# Patient Record
Sex: Female | Born: 1975 | Race: White | Hispanic: No | Marital: Single | Smoking: Never smoker
Health system: Southern US, Community
[De-identification: ages and names within clinical notes are randomized; demographics above are authoritative.]

## PROBLEM LIST (undated history)

## (undated) DIAGNOSIS — K219 Gastro-esophageal reflux disease without esophagitis: Secondary | ICD-10-CM

## (undated) DIAGNOSIS — K589 Irritable bowel syndrome without diarrhea: Secondary | ICD-10-CM

## (undated) DIAGNOSIS — D649 Anemia, unspecified: Secondary | ICD-10-CM

## (undated) DIAGNOSIS — I10 Essential (primary) hypertension: Secondary | ICD-10-CM

## (undated) DIAGNOSIS — F329 Major depressive disorder, single episode, unspecified: Secondary | ICD-10-CM

## (undated) DIAGNOSIS — C801 Malignant (primary) neoplasm, unspecified: Secondary | ICD-10-CM

## (undated) DIAGNOSIS — F32A Depression, unspecified: Secondary | ICD-10-CM

## (undated) HISTORY — PX: CHOLECYSTECTOMY: SHX55

## (undated) HISTORY — PX: PARTIAL NEPHRECTOMY: SHX414

---

## 2010-09-08 ENCOUNTER — Encounter: Payer: Self-pay | Admitting: *Deleted

## 2010-09-08 ENCOUNTER — Emergency Department (HOSPITAL_BASED_OUTPATIENT_CLINIC_OR_DEPARTMENT_OTHER)
Admission: EM | Admit: 2010-09-08 | Discharge: 2010-09-08 | Disposition: A | Discharge status: Home / Self Care | Payer: BC Managed Care – PPO | Attending: Emergency Medicine | Admitting: Emergency Medicine

## 2010-09-08 DIAGNOSIS — H6090 Unspecified otitis externa, unspecified ear: Secondary | ICD-10-CM

## 2010-09-08 DIAGNOSIS — H60399 Other infective otitis externa, unspecified ear: Secondary | ICD-10-CM | POA: Insufficient documentation

## 2010-09-08 DIAGNOSIS — I10 Essential (primary) hypertension: Secondary | ICD-10-CM | POA: Insufficient documentation

## 2010-09-08 DIAGNOSIS — H9209 Otalgia, unspecified ear: Secondary | ICD-10-CM | POA: Insufficient documentation

## 2010-09-08 HISTORY — DX: Essential (primary) hypertension: I10

## 2010-09-08 MED ORDER — CIPROFLOXACIN-DEXAMETHASONE 0.3-0.1 % OT SUSP
OTIC | Status: AC
  Filled 2010-09-08: qty 7.5

## 2010-09-08 MED ORDER — CIPROFLOXACIN-DEXAMETHASONE 0.3-0.1 % OT SUSP
4.0000 [drp] | Freq: Two times a day (BID) | OTIC | Status: DC
  Administered 2010-09-08: 4 [drp] via OTIC

## 2010-09-08 NOTE — ED Notes (Signed)
Poss abscess left ear x 1 week. Painful

## 2010-09-08 NOTE — ED Provider Notes (Signed)
Medical screening examination/treatment/procedure(s) were performed by non-physician practitioner and as supervising physician I was immediately available for consultation/collaboration.   Glynn Octave, MD 09/08/10 [phone]

## 2010-09-08 NOTE — ED Provider Notes (Signed)
History     Chief Complaint  Patient presents with  . Ear Problem   Patient is a 35 y.o. female presenting with ear pain. The history is provided by the patient. No language interpreter was used.  Otalgia This is a recurrent problem. The current episode started more than 1 week ago. There is pain in the left ear. The problem occurs constantly. The problem has not changed since onset.The pain is moderate. Pertinent negatives include no ear discharge, no headaches, no hearing loss, no rhinorrhea and no rash. Past medical history comments: pt state that it feels like she has a bump in her ear.    Past Medical History  Diagnosis Date  . Hypertension     Past Surgical History  Procedure Date  . Cholecystectomy     No family history on file.  History  Substance Use Topics  . Smoking status: Never Smoker   . Smokeless tobacco: Not on file  . Alcohol Use: No    OB History    Grav Para Term Preterm Abortions TAB SAB Ect Mult Living                  Review of Systems  HENT: Positive for ear pain. Negative for hearing loss, rhinorrhea and ear discharge.   Eyes: Negative.   Respiratory: Negative.   Cardiovascular: Negative.   Skin: Negative for rash.  Neurological: Negative for headaches.    Physical Exam  BP 145/96  Pulse 104  Temp(Src) 98.9 F (37.2 C) (Oral)  Resp 20  SpO2 99%  Physical Exam  Nursing note and vitals reviewed. Constitutional: She appears well-developed and well-nourished.  HENT:  Right Ear: Tympanic membrane and external ear normal.  Left Ear: Tympanic membrane normal. There is swelling.  Ears:  Neck: Normal range of motion.  Cardiovascular: Normal rate and regular rhythm.   Pulmonary/Chest: Effort normal and breath sounds normal.  Neurological: She is alert.  Skin: Skin is warm.    ED Course  Procedures  MDM Will treat for otitis externa:no earwick needed at this time      Teressa Lower, NP 09/08/10 2009

## 2011-03-14 ENCOUNTER — Encounter (HOSPITAL_BASED_OUTPATIENT_CLINIC_OR_DEPARTMENT_OTHER): Payer: Self-pay | Admitting: *Deleted

## 2011-03-14 ENCOUNTER — Emergency Department (HOSPITAL_BASED_OUTPATIENT_CLINIC_OR_DEPARTMENT_OTHER)
Admission: EM | Admit: 2011-03-14 | Discharge: 2011-03-14 | Disposition: A | Discharge status: Home / Self Care | Payer: BC Managed Care – PPO | Attending: Emergency Medicine | Admitting: Emergency Medicine

## 2011-03-14 DIAGNOSIS — L0291 Cutaneous abscess, unspecified: Secondary | ICD-10-CM

## 2011-03-14 DIAGNOSIS — I10 Essential (primary) hypertension: Secondary | ICD-10-CM | POA: Insufficient documentation

## 2011-03-14 DIAGNOSIS — N764 Abscess of vulva: Secondary | ICD-10-CM | POA: Insufficient documentation

## 2011-03-14 DIAGNOSIS — Z79899 Other long term (current) drug therapy: Secondary | ICD-10-CM | POA: Insufficient documentation

## 2011-03-14 MED ORDER — DOXYCYCLINE HYCLATE 100 MG PO CAPS: 100.0000 mg | ORAL_CAPSULE | Freq: Two times a day (BID) | ORAL | Status: AC

## 2011-03-14 MED ORDER — HYDROCODONE-ACETAMINOPHEN 5-500 MG PO TABS: 1.0000 | ORAL_TABLET | Freq: Four times a day (QID) | ORAL | Status: AC | PRN

## 2011-03-14 NOTE — ED Notes (Signed)
Boil in groin for 3 days no drainage

## 2011-03-14 NOTE — ED Notes (Signed)
Secondary Assessment- Pt reports a red, swollen painful area on right labia majora.  No drainage noted.

## 2011-03-14 NOTE — ED Notes (Signed)
Vrinda Pickering, FNP at bedside 

## 2011-03-14 NOTE — ED Provider Notes (Signed)
History     CSN: [id number]  Arrival date & time 03/14/11  1639   First MD Initiated Contact with Patient 03/14/11 1711      Chief Complaint  Patient presents with  . Recurrent Skin Infections    (Consider location/radiation/quality/duration/timing/severity/associated sxs/prior treatment) HPI Comments: Pt states that she thinks that she has a abscess or her labia  Patient is a 36 y.o. female presenting with abscess. The history is provided by the patient. No language interpreter was used.  Abscess  This is a new problem. The current episode started less than one week ago. The problem occurs continuously. The problem has been gradually worsening. The abscess is present on the genitalia. The abscess is characterized by painfulness and swelling. The abscess first occurred at home. Her past medical history does not include skin abscesses in family. There were no sick contacts. She has received no recent medical care.    Past Medical History  Diagnosis Date  . Hypertension     Past Surgical History  Procedure Date  . Cholecystectomy     History reviewed. No pertinent family history.  History  Substance Use Topics  . Smoking status: Never Smoker   . Smokeless tobacco: Not on file  . Alcohol Use: No    OB History    Grav Para Term Preterm Abortions TAB SAB Ect Mult Living                  Review of Systems  All other systems reviewed and are negative.    Allergies  Review of patient's allergies indicates no known allergies.  Home Medications   Current Outpatient Rx  Name Route Sig Dispense Refill  . BUPROPION HCL ER (XL) 300 MG PO TB24 Oral Take 300 mg by mouth daily.      . METHYLDOPA 500 MG PO TABS Oral Take 500 mg by mouth 2 (two) times daily.      Marland Kitchen RANITIDINE HCL 150 MG PO TABS Oral Take 150 mg by mouth daily.    Marland Kitchen DOXYCYCLINE HYCLATE 100 MG PO CAPS Oral Take 1 capsule (100 mg total) by mouth 2 (two) times daily. 14 capsule 0  . HYDROCODONE-ACETAMINOPHEN  5-500 MG PO TABS Oral Take 1-2 tablets by mouth every 6 (six) hours as needed for pain. 6 tablet 0    BP 156/100  Pulse 86  Temp(Src) 97.9 F (36.6 C) (Oral)  Resp 20  Ht 5\' 8"  (1.727 m)  Wt 272 lb (123.378 kg)  BMI 41.36 kg/m2  SpO2 99%  LMP 02/28/2011  Physical Exam  Nursing note and vitals reviewed. Constitutional: She is oriented to person, place, and time. She appears well-developed and well-nourished.  Cardiovascular: Normal rate and regular rhythm.   Pulmonary/Chest: Effort normal and breath sounds normal.  Musculoskeletal: Normal range of motion.  Neurological: She is alert and oriented to person, place, and time.  Skin:       Pt has redness and swelling to the right labia    ED Course  INCISION AND DRAINAGE Performed by: Teressa Lower Authorized by: Teressa Lower Consent: Verbal consent obtained. Written consent not obtained. Consent given by: patient Patient identity confirmed: verbally with patient Time out: Immediately prior to procedure a "time out" was called to verify the correct patient, procedure, equipment, support staff and site/side marked as required. Type: abscess Body area: anogenital (right labia) Anesthesia: local infiltration Local anesthetic: lidocaine 2% with epinephrine Scalpel size: 11 Incision type: single straight Drainage: purulent Drainage amount: moderate Packing  material: 1/2 in iodoform gauze Patient tolerance: Patient tolerated the procedure well with no immediate complications.   (including critical care time)  Labs Reviewed - No data to display No results found.   1. Abscess       MDM  Wound opened and pt tolerated procedure without any problem        Teressa Lower, NP 03/14/11 1740

## 2011-03-15 NOTE — ED Provider Notes (Signed)
Medical screening examination/treatment/procedure(s) were performed by non-physician practitioner and as supervising physician I was immediately available for consultation/collaboration.  Virtie Bungert, MD 03/15/11 1522 

## 2013-05-20 ENCOUNTER — Emergency Department (HOSPITAL_BASED_OUTPATIENT_CLINIC_OR_DEPARTMENT_OTHER)
Admission: EM | Admit: 2013-05-20 | Discharge: 2013-05-20 | Disposition: A | Discharge status: Home / Self Care | Payer: BC Managed Care – PPO | Attending: Emergency Medicine | Admitting: Emergency Medicine

## 2013-05-20 ENCOUNTER — Encounter (HOSPITAL_BASED_OUTPATIENT_CLINIC_OR_DEPARTMENT_OTHER): Payer: Self-pay | Admitting: Emergency Medicine

## 2013-05-20 DIAGNOSIS — J069 Acute upper respiratory infection, unspecified: Secondary | ICD-10-CM | POA: Insufficient documentation

## 2013-05-20 DIAGNOSIS — Z79899 Other long term (current) drug therapy: Secondary | ICD-10-CM | POA: Insufficient documentation

## 2013-05-20 DIAGNOSIS — I10 Essential (primary) hypertension: Secondary | ICD-10-CM | POA: Insufficient documentation

## 2013-05-20 LAB — RAPID STREP SCREEN (MED CTR MEBANE ONLY): Streptococcus, Group A Screen (Direct): NEGATIVE

## 2013-05-20 NOTE — ED Notes (Signed)
Pt amb to room 10 with quick steady gait in nad. Pt reports sudden onset of sore throat, painful swallowing and body aches yesterday. Strep obtained during triage, sent for testing.

## 2013-05-20 NOTE — ED Notes (Signed)
MD at bedside. 

## 2013-05-20 NOTE — ED Provider Notes (Signed)
CSN: [id number]     Arrival date & time 05/20/13  5400 History   First MD Initiated Contact with Patient 05/20/13 [phone]     Chief Complaint  Patient presents with  . Sore Throat  . Generalized Body Aches  . Nasal Congestion     (Consider location/radiation/quality/duration/timing/severity/associated sxs/prior Treatment) Patient is a 38 y.o. female presenting with pharyngitis.  Sore Throat   Pt reports onset of sore throat, chills and body aches last night after getting home from work. Has had some nasal congestion and mild cough recently as well. Was sent home from work due to being sick this morning.   Past Medical History  Diagnosis Date  . Hypertension    Past Surgical History  Procedure Laterality Date  . Cholecystectomy     History reviewed. No pertinent family history. History  Substance Use Topics  . Smoking status: Never Smoker   . Smokeless tobacco: Not on file  . Alcohol Use: No   OB History   Grav Para Term Preterm Abortions TAB SAB Ect Mult Living                 Review of Systems  All other systems reviewed and are negative except as noted in HPI.    Allergies  Review of patient's allergies indicates no known allergies.  Home Medications   Current Outpatient Rx  Name  Route  Sig  Dispense  Refill  . labetalol (NORMODYNE) 200 MG tablet   Oral   Take 200 mg by mouth 2 (two) times daily.         Marland Kitchen omeprazole (PRILOSEC) 20 MG capsule   Oral   Take 20 mg by mouth daily.         Marland Kitchen buPROPion (WELLBUTRIN XL) 300 MG 24 hr tablet   Oral   Take 300 mg by mouth daily.           . methyldopa (ALDOMET) 500 MG tablet   Oral   Take 500 mg by mouth 2 (two) times daily.           . ranitidine (ZANTAC 150 MAXIMUM STRENGTH) 150 MG tablet   Oral   Take 150 mg by mouth daily.          BP 155/94  Pulse 107  Temp(Src) 99 F (37.2 C) (Oral)  Ht 5\' 8"  (1.727 m)  Wt 273 lb (123.832 kg)  BMI 41.52 kg/m2  SpO2 99%  LMP 05/11/2013 Physical Exam   Nursing note and vitals reviewed. Constitutional: She is oriented to person, place, and time. She appears well-developed and well-nourished.  HENT:  Head: Normocephalic and atraumatic.  Mouth/Throat: No oropharyngeal exudate.  Eyes: EOM are normal. Pupils are equal, round, and reactive to light.  Neck: Normal range of motion. Neck supple.  Cardiovascular: Normal rate, normal heart sounds and intact distal pulses.   Pulmonary/Chest: Effort normal and breath sounds normal.  Abdominal: Bowel sounds are normal. She exhibits no distension. There is no tenderness.  Musculoskeletal: Normal range of motion. She exhibits no edema and no tenderness.  Lymphadenopathy:    She has no cervical adenopathy.  Neurological: She is alert and oriented to person, place, and time. She has normal strength. No cranial nerve deficit or sensory deficit.  Skin: Skin is warm and dry. No rash noted.  Psychiatric: She has a normal mood and affect.    ED Course  Procedures (including critical care time) Labs Review Labs Reviewed  RAPID STREP SCREEN  CULTURE, GROUP A  STREP   Imaging Review No results found.   EKG Interpretation None      MDM   Final diagnoses:  Viral URI    Strep neg. Likely viral URI.     Aizley Stenseth B. Karle Starch, MD 05/20/13 [phone]

## 2013-05-20 NOTE — Discharge Instructions (Signed)

## 2013-05-22 LAB — CULTURE, GROUP A STREP

## 2013-10-15 ENCOUNTER — Encounter (HOSPITAL_BASED_OUTPATIENT_CLINIC_OR_DEPARTMENT_OTHER): Payer: Self-pay | Admitting: Emergency Medicine

## 2013-10-15 ENCOUNTER — Emergency Department (HOSPITAL_BASED_OUTPATIENT_CLINIC_OR_DEPARTMENT_OTHER)
Admission: EM | Admit: 2013-10-15 | Discharge: 2013-10-15 | Disposition: A | Discharge status: Home / Self Care | Payer: BC Managed Care – PPO | Attending: Emergency Medicine | Admitting: Emergency Medicine

## 2013-10-15 DIAGNOSIS — R109 Unspecified abdominal pain: Secondary | ICD-10-CM | POA: Insufficient documentation

## 2013-10-15 DIAGNOSIS — Z79899 Other long term (current) drug therapy: Secondary | ICD-10-CM | POA: Insufficient documentation

## 2013-10-15 DIAGNOSIS — Z3202 Encounter for pregnancy test, result negative: Secondary | ICD-10-CM | POA: Diagnosis not present

## 2013-10-15 DIAGNOSIS — Z792 Long term (current) use of antibiotics: Secondary | ICD-10-CM | POA: Insufficient documentation

## 2013-10-15 DIAGNOSIS — I10 Essential (primary) hypertension: Secondary | ICD-10-CM | POA: Insufficient documentation

## 2013-10-15 DIAGNOSIS — N390 Urinary tract infection, site not specified: Secondary | ICD-10-CM | POA: Diagnosis not present

## 2013-10-15 LAB — URINALYSIS, ROUTINE W REFLEX MICROSCOPIC
Bilirubin Urine: NEGATIVE
Glucose, UA: NEGATIVE mg/dL
HGB URINE DIPSTICK: NEGATIVE
Ketones, ur: NEGATIVE mg/dL
NITRITE: NEGATIVE
Protein, ur: 100 mg/dL — AB
SPECIFIC GRAVITY, URINE: 1.026 (ref 1.005–1.030)
UROBILINOGEN UA: 0.2 mg/dL (ref 0.0–1.0)
pH: 5.5 (ref 5.0–8.0)

## 2013-10-15 LAB — URINE MICROSCOPIC-ADD ON

## 2013-10-15 LAB — PREGNANCY, URINE: PREG TEST UR: NEGATIVE

## 2013-10-15 MED ORDER — CEPHALEXIN 250 MG PO CAPS
500.0000 mg | ORAL_CAPSULE | Freq: Once | ORAL | Status: AC
  Administered 2013-10-15: 500 mg via ORAL
  Filled 2013-10-15: qty 2

## 2013-10-15 MED ORDER — CEPHALEXIN 500 MG PO CAPS
500.0000 mg | ORAL_CAPSULE | Freq: Four times a day (QID) | ORAL | Status: DC
Start: 2013-10-15 — End: 2015-11-26

## 2013-10-15 NOTE — ED Notes (Signed)
Pt discharged to home with family. NAD.  

## 2013-10-15 NOTE — ED Notes (Signed)
Pt reports left flank pain x1 week also reports feq urination

## 2013-10-15 NOTE — Discharge Instructions (Signed)
°  Take acetaminophen (Tylenol) up to 975 mg (this is normally 3 over-the-counter pills) up to 3 times a day. Do not drink alcohol. Make sure your other medications do not contain acetaminophen (Read the labels!)  Take your antibiotics as directed and to completion. You should never have any leftover antibiotics! Push fluids and stay well hydrated.   Please follow with your primary care doctor in the next 2 days for a check-up. They must obtain records for further management.   Do not hesitate to return to the Emergency Department for any new, worsening or concerning symptoms.

## 2013-10-15 NOTE — ED Provider Notes (Signed)
CSN: [id number]     Arrival date & time 10/15/13  1912 History   First MD Initiated Contact with Patient 10/15/13 1927     Chief Complaint  Patient presents with  . Flank Pain     (Consider location/radiation/quality/duration/timing/severity/associated sxs/prior Treatment) HPI  Valerie Whitehead is a 38 y.o. female complaining of left flank pain worsening over the course of one week described as sharp and stabbing, it is colicky, rated at 4/27 at worse. Patient has been taking Motrin at home with little relief. Patient denies hematuria, dysuria, foul-smelling more concentrated urine, abnormal vaginal discharge, history of kidney stones, trauma. Endorses nausea and urinary frequency on review of systems.  Past Medical History  Diagnosis Date  . Hypertension    Past Surgical History  Procedure Laterality Date  . Cholecystectomy     History reviewed. No pertinent family history. History  Substance Use Topics  . Smoking status: Never Smoker   . Smokeless tobacco: Never Used  . Alcohol Use: No   OB History   Grav Para Term Preterm Abortions TAB SAB Ect Mult Living                 Review of Systems  10 systems reviewed and found to be negative, except as noted in the HPI.   Allergies  Review of patient's allergies indicates no known allergies.  Home Medications   Prior to Admission medications   Medication Sig Start Date End Date Taking? Authorizing Provider  buPROPion (WELLBUTRIN XL) 300 MG 24 hr tablet Take 300 mg by mouth daily.      Historical Provider, MD  cephALEXin (KEFLEX) 500 MG capsule Take 1 capsule (500 mg total) by mouth 4 (four) times daily. 10/15/13   Josephanthony Tindel, PA-C  labetalol (NORMODYNE) 200 MG tablet Take 200 mg by mouth 2 (two) times daily.    Historical Provider, MD  omeprazole (PRILOSEC) 20 MG capsule Take 20 mg by mouth daily.    Historical Provider, MD   BP 173/100  Pulse 86  Temp(Src) 97.9 F (36.6 C) (Oral)  Resp 18  SpO2 100%  LMP  10/01/2013 Physical Exam  Nursing note and vitals reviewed. Constitutional: She is oriented to person, place, and time. She appears well-developed and well-nourished. No distress.  HENT:  Head: Normocephalic.  Mouth/Throat: Oropharynx is clear and moist.  Eyes: Conjunctivae and EOM are normal. Pupils are equal, round, and reactive to light.  Cardiovascular: Normal rate, regular rhythm and intact distal pulses.   Pulmonary/Chest: Effort normal and breath sounds normal. No stridor. No respiratory distress. She has no wheezes. She has no rales. She exhibits no tenderness.  Abdominal: Soft. Bowel sounds are normal. She exhibits no distension and no mass. There is no tenderness. There is no rebound and no guarding.  Genitourinary:  No CVA tenderness to palpation bilaterally  Musculoskeletal: Normal range of motion.  Neurological: She is alert and oriented to person, place, and time.  Psychiatric: She has a normal mood and affect.    ED Course  Procedures (including critical care time) Labs Review Labs Reviewed  URINALYSIS, ROUTINE W REFLEX MICROSCOPIC - Abnormal; Notable for the following:    APPearance CLOUDY (*)    Protein, ur 100 (*)    Leukocytes, UA TRACE (*)    All other components within normal limits  URINE MICROSCOPIC-ADD ON - Abnormal; Notable for the following:    Squamous Epithelial / LPF MANY (*)    Bacteria, UA MANY (*)    All other components within  normal limits  URINE CULTURE  PREGNANCY, URINE    Imaging Review No results found.   EKG Interpretation None      MDM   Final diagnoses:  UTI (lower urinary tract infection)  Flank pain    Filed Vitals:   10/15/13 1920  BP: 173/100  Pulse: 86  Temp: 97.9 F (36.6 C)  TempSrc: Oral  Resp: 18  SpO2: 100%    Medications  cephALEXin (KEFLEX) capsule 500 mg (not administered)    Valerie Whitehead is a 38 y.o. female presenting with left flank pain and urinary frequency. Patient afebrile and  well-appearing. Urinalysis is contaminated but shows many bacteria and trace leukocytes. Will culture urine and start treatment with Keflex.  Evaluation does not show pathology that would require ongoing emergent intervention or inpatient treatment. Pt is hemodynamically stable and mentating appropriately. Discussed findings and plan with patient/guardian, who agrees with care plan. All questions answered. Return precautions discussed and outpatient follow up given.   New Prescriptions   CEPHALEXIN (KEFLEX) 500 MG CAPSULE    Take 1 capsule (500 mg total) by mouth 4 (four) times daily.         Monico Blitz, PA-C 10/15/13 2033

## 2013-10-16 NOTE — ED Provider Notes (Signed)
Medical screening examination/treatment/procedure(s) were performed by non-physician practitioner and as supervising physician I was immediately available for consultation/collaboration.   EKG Interpretation None        Fredia Sorrow, MD 10/16/13 [phone]

## 2013-10-17 LAB — URINE CULTURE: Colony Count: 30000

## 2013-12-29 ENCOUNTER — Emergency Department (HOSPITAL_BASED_OUTPATIENT_CLINIC_OR_DEPARTMENT_OTHER)
Admission: EM | Admit: 2013-12-29 | Discharge: 2013-12-29 | Disposition: A | Discharge status: Home / Self Care | Payer: BC Managed Care – PPO | Attending: Emergency Medicine | Admitting: Emergency Medicine

## 2013-12-29 ENCOUNTER — Encounter (HOSPITAL_BASED_OUTPATIENT_CLINIC_OR_DEPARTMENT_OTHER): Payer: Self-pay | Admitting: Emergency Medicine

## 2013-12-29 DIAGNOSIS — I10 Essential (primary) hypertension: Secondary | ICD-10-CM | POA: Diagnosis not present

## 2013-12-29 DIAGNOSIS — Z792 Long term (current) use of antibiotics: Secondary | ICD-10-CM | POA: Diagnosis not present

## 2013-12-29 DIAGNOSIS — M62838 Other muscle spasm: Secondary | ICD-10-CM | POA: Insufficient documentation

## 2013-12-29 DIAGNOSIS — Y998 Other external cause status: Secondary | ICD-10-CM | POA: Diagnosis not present

## 2013-12-29 DIAGNOSIS — S161XXA Strain of muscle, fascia and tendon at neck level, initial encounter: Secondary | ICD-10-CM | POA: Diagnosis not present

## 2013-12-29 DIAGNOSIS — S199XXA Unspecified injury of neck, initial encounter: Secondary | ICD-10-CM | POA: Diagnosis present

## 2013-12-29 DIAGNOSIS — Z79899 Other long term (current) drug therapy: Secondary | ICD-10-CM | POA: Insufficient documentation

## 2013-12-29 DIAGNOSIS — Y9389 Activity, other specified: Secondary | ICD-10-CM | POA: Diagnosis not present

## 2013-12-29 DIAGNOSIS — Y92410 Unspecified street and highway as the place of occurrence of the external cause: Secondary | ICD-10-CM | POA: Diagnosis not present

## 2013-12-29 MED ORDER — HYDROCODONE-ACETAMINOPHEN 5-325 MG PO TABS: 1.0000 | ORAL_TABLET | ORAL | Status: DC | PRN

## 2013-12-29 MED ORDER — IBUPROFEN 800 MG PO TABS: 800.0000 mg | ORAL_TABLET | Freq: Three times a day (TID) | ORAL | Status: DC

## 2013-12-29 MED ORDER — DIAZEPAM 5 MG PO TABS: 5.0000 mg | ORAL_TABLET | Freq: Two times a day (BID) | ORAL | Status: DC
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
## 2015-11-26 ENCOUNTER — Emergency Department (HOSPITAL_BASED_OUTPATIENT_CLINIC_OR_DEPARTMENT_OTHER): Payer: BLUE CROSS/BLUE SHIELD

## 2015-11-26 ENCOUNTER — Encounter (HOSPITAL_BASED_OUTPATIENT_CLINIC_OR_DEPARTMENT_OTHER): Payer: Self-pay

## 2015-11-26 ENCOUNTER — Emergency Department (HOSPITAL_BASED_OUTPATIENT_CLINIC_OR_DEPARTMENT_OTHER)
Admission: EM | Admit: 2015-11-26 | Discharge: 2015-11-26 | Disposition: A | Discharge status: Home / Self Care | Payer: BLUE CROSS/BLUE SHIELD | Attending: Emergency Medicine | Admitting: Emergency Medicine

## 2015-11-26 DIAGNOSIS — M549 Dorsalgia, unspecified: Secondary | ICD-10-CM | POA: Insufficient documentation

## 2015-11-26 DIAGNOSIS — I10 Essential (primary) hypertension: Secondary | ICD-10-CM | POA: Diagnosis not present

## 2015-11-26 DIAGNOSIS — Z79899 Other long term (current) drug therapy: Secondary | ICD-10-CM | POA: Diagnosis not present

## 2015-11-26 DIAGNOSIS — R079 Chest pain, unspecified: Secondary | ICD-10-CM | POA: Diagnosis not present

## 2015-11-26 HISTORY — DX: Gastro-esophageal reflux disease without esophagitis: K21.9

## 2015-11-26 HISTORY — DX: Depression, unspecified: F32.A

## 2015-11-26 HISTORY — DX: Major depressive disorder, single episode, unspecified: F32.9

## 2015-11-26 LAB — D-DIMER, QUANTITATIVE (NOT AT ARMC): D DIMER QUANT: 0.36 ug{FEU}/mL (ref 0.00–0.50)

## 2015-11-26 LAB — TROPONIN I
TROPONIN I: 0.03 ng/mL — AB (ref <0.03)
Troponin I: 0.03 ng/mL (ref <0.03)

## 2015-11-26 LAB — CBC
HEMATOCRIT: 36.1 % (ref 36.0–46.0)
Hemoglobin: 11.7 g/dL — ABNORMAL LOW (ref 12.0–15.0)
MCH: 26.4 pg (ref 26.0–34.0)
MCHC: 32.4 g/dL (ref 30.0–36.0)
MCV: 81.5 fL (ref 78.0–100.0)
Platelets: 405 10*3/uL — ABNORMAL HIGH (ref 150–400)
RBC: 4.43 MIL/uL (ref 3.87–5.11)
RDW: 14.6 % (ref 11.5–15.5)
WBC: 7.2 10*3/uL (ref 4.0–10.5)

## 2015-11-26 LAB — BASIC METABOLIC PANEL
Anion gap: 7 (ref 5–15)
BUN: 11 mg/dL (ref 6–20)
CO2: 26 mmol/L (ref 22–32)
Calcium: 9.4 mg/dL (ref 8.9–10.3)
Chloride: 105 mmol/L (ref 101–111)
Creatinine, Ser: 0.83 mg/dL (ref 0.44–1.00)
GFR calc Af Amer: >60 mL/min (ref >60)
GLUCOSE: 104 mg/dL — AB (ref 65–99)
POTASSIUM: 3.5 mmol/L (ref 3.5–5.1)
Sodium: 138 mmol/L (ref 135–145)

## 2015-11-26 MED ORDER — ASPIRIN 81 MG PO CHEW
324.0000 mg | CHEWABLE_TABLET | Freq: Once | ORAL | Status: AC
  Administered 2015-11-26: 324 mg via ORAL
  Filled 2015-11-26: qty 4

## 2015-11-26 NOTE — Discharge Instructions (Signed)
Call the cardiology clinic listed above to schedule a follow-up appointment within the next week for further evaluation and management of your chest pain. I recommend having an outpatient stress test scheduled for further evaluation. Please return to the Emergency Department if symptoms worsen or new onset of fever, difficulty breathing, new/worsening chest pain, abdominal pain, vomiting, numbness, tingling, weakness.

## 2015-11-26 NOTE — ED Triage Notes (Signed)
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

## 2015-11-26 NOTE — ED Notes (Signed)
Pa  at bedside. 

## 2015-11-26 NOTE — ED Provider Notes (Signed)
[facility] Provider Note   CSN: [id number] Arrival date & time: 11/26/15  1205     History   Chief Complaint Chief Complaint  Patient presents with  . Chest Pain    HPI Raizel Perna is a 40 y.o. female.  Patient is a 40 year old female with history of hypertension who presents the ED with complaint of chest pain, onset 10 AM. Patient reports when she woke up this morning she began to have constant sharp pain to the left side of her chest. Patient reports pain is worse with movements of her chest or left arm. Denies any alleviating factors. Denies radiation. She also notes 3 days ago she began having pain in her left mid back which she describes as constant sharp pain and notes it is worse with movement. 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 Denies taking any medications for her symptoms today but notes she has taken ibuprofen over the past 2 days with mild intermittent relief. Denies fever, chills, headache, lightheadedness, dizziness, diaphoresis, cough, shortness of breath, palpitations, abdominal pain, nausea, vomiting, numbness, tingling, weakness. Patient denies smoking. Denies personal or family history of cardiac disease.      Past Medical History:  Diagnosis Date  . Depression   . GERD (gastroesophageal reflux disease)   . Hypertension     There are no active problems to display for this patient.   Past Surgical History:  Procedure Laterality Date  . CHOLECYSTECTOMY      OB History    No data available       Home Medications    Prior to Admission medications   Medication Sig Start Date End Date Taking? Authorizing Provider  buPROPion (WELLBUTRIN XL) 300 MG 24 hr tablet Take 300 mg by mouth daily.      Historical Provider, MD  labetalol (NORMODYNE) 200 MG tablet Take 200 mg by mouth 2 (two) times daily.    Historical Provider, MD  omeprazole (PRILOSEC) 20 MG  capsule Take 20 mg by mouth daily.    Historical Provider, MD    Family History No family history on file.  Social History Social History  Substance Use Topics  . Smoking status: Never Smoker  . Smokeless tobacco: Never Used  . Alcohol use No     Allergies   Review of patient's allergies indicates no known allergies.   Review of Systems Review of Systems  Cardiovascular: Positive for chest pain.  Musculoskeletal: Positive for back pain.  All other systems reviewed and are negative.    Physical Exam Updated Vital Signs BP 134/86   Pulse 93   Temp 98.6 F (37 C) (Oral)   Resp (!) 29   LMP 11/14/2015   SpO2 99%   Physical Exam  Constitutional: She is oriented to person, place, and time. She appears well-developed and well-nourished.  HENT:  Head: Normocephalic and atraumatic.  Mouth/Throat: Oropharynx is clear and moist. No oropharyngeal exudate.  Eyes: Conjunctivae and EOM are normal. Right eye exhibits no discharge. Left eye exhibits no discharge. No scleral icterus.  Neck: Normal range of motion. Neck supple.  Cardiovascular: Normal rate, regular rhythm, normal heart sounds and intact distal pulses.   Pulmonary/Chest: Effort normal and breath sounds normal. No respiratory distress. She has no wheezes. She has no rales. She exhibits no tenderness.  Abdominal: Soft. Bowel sounds are normal. She exhibits no distension and no mass. There is no tenderness. There  is no rebound and no guarding.  Musculoskeletal: Normal range of motion. She exhibits tenderness. She exhibits no edema or deformity.  No midline C, T, or L tenderness. TTP over left lateral inferior latissimus inferior to scapula. Full range of motion of neck and back. Full range of motion of bilateral upper and lower extremities, with 5/5 strength. Sensation intact. 2+ radial and PT pulses. Cap refill <2 seconds.   Neurological: She is alert and oriented to person, place, and time.  Skin: Skin is warm and dry.    Nursing note and vitals reviewed.    ED Treatments / Results  Labs (all labs ordered are listed, but only abnormal results are displayed) Labs Reviewed  BASIC METABOLIC PANEL - Abnormal; Notable for the following:       Result Value   Glucose, Bld 104 (*)    All other components within normal limits  CBC - Abnormal; Notable for the following:    Hemoglobin 11.7 (*)    Platelets 405 (*)    All other components within normal limits  TROPONIN I - Abnormal; Notable for the following:    Troponin I 0.03 (*)    All other components within normal limits  TROPONIN I - Abnormal; Notable for the following:    Troponin I 0.03 (*)    All other components within normal limits  D-DIMER, QUANTITATIVE (NOT AT [facility])    EKG  EKG Interpretation  Date/Time:  Monday November 26 2015 12:12:47 EDT Ventricular Rate:  111 PR Interval:  148 QRS Duration: 88 QT Interval:  348 QTC Calculation: 473 R Axis:   73 Text Interpretation:  Sinus tachycardia Otherwise normal ECG Confirmed by Hazle Coca [phone]) on 11/26/2015 12:16:01 PM Also confirmed by Hazle Coca [phone]), editor Yehuda Mao [phone])  on 11/26/2015 12:26:27 PM       Radiology Dg Chest 2 View  Result Date: 11/26/2015 CLINICAL DATA:  Left-sided chest pain, hypertension EXAM: CHEST  2 VIEW COMPARISON:  Chest radiograph 11/25/2014 FINDINGS: Cardiomediastinal contours are normal. No pneumothorax or pleural effusion. No focal airspace consolidation or pulmonary edema. IMPRESSION: Clear lungs. Electronically Signed   By: Ulyses Jarred M.D.   On: 11/26/2015 13:54    Procedures Procedures (including critical care time)  Medications Ordered in ED Medications  aspirin chewable tablet 324 mg (324 mg Oral Given 11/26/15 1423)     Initial Impression / Assessment and Plan / ED Course  I have reviewed the triage vital signs and the nursing notes.  Pertinent labs & imaging results that were available during my care of the patient were  reviewed by me and considered in my medical decision making (see chart for details).  Clinical Course    Patient presents with constant sharp left-sided chest pain that started this morning when she woke up around 10 AM. She also reports having similar pain in her back that started 3 days ago and notes both areas of pain are worse with movement. Denies fever or shortness of breath. VSS. Exam revealed mild tenderness over muscles of left mid back, no midline spinal tenderness. Lungs clear to auscultation bilaterally, no chest wall tenderness. Remaining exam unremarkable. EKG showed sinus tachycardia, heart rate 111 with no acute ischemic changes noted. Initial troponin 0.03. Remaining labs unremarkable. D-dimer negative. Chest x-ray negative. HEART score 2. Discussed case with Dr. Ralene Bathe. Plan to order delta trop. Discussed pt with cardiology. Dr. Oval Linsey advised that if delta trop is unchanged or negative, pt can be d/c home with outpatient cardiac  follow up for stress testing; if delta trop increased advised to admit for further evaluation. Delta trop unchanged. Discussed results and plan for discharge with patient. Plan to discharge patient home with outpatient cardiology follow-up for outpatient stress test. Discussed return precautions with patient.  Final Clinical Impressions(s) / ED Diagnoses   Final diagnoses:  Chest pain, unspecified type    New Prescriptions New Prescriptions   No medications on file       Nona Dell, PA-C 11/26/15 [place], MD 11/27/15 1000

## 2015-11-26 NOTE — ED Notes (Signed)
CRITICAL VALUE ALERT  Critical value received:  Troponin 0.03  Date of notification:  11/26/15 Time of notification:  [id number]  Critical value read back: yes  Nurse who received alert:  Su Grand  MD notified (1st page):  [phone]

## 2015-12-09 NOTE — Progress Notes (Signed)
Cardiology Office Note   Date:  12/10/2015   ID:  Valerie Whitehead, DOB 30-Jan-1976, MRN [id number]  PCP:  Alma Friendly, MD  Cardiologist:   Minus Breeding, MD  Referring:  Nona Dell, PA-C   Chief Complaint  Patient presents with  . Follow-up    ED visit 11-26-15     History of Present Illness: Valerie Whitehead is a 40 y.o. female who presents for evaluation of chest pain.  He was in the ED recently with chest pain.  The pain was atypical but the troponin was 0.03 x 2.  I reviewed the ED records.  There were no acute EKG changes.  She reports that she's been under a lot of stress. She works 2 jobs. She is raising her 36 year old nephew was being raised by her parents who both died. She also has 2 young children of her own. She reports she was having discomfort and went to the emergency room in October. She thought in retrospect was probably something related to lifting or pulling. He works as a Quarry manager. She does not get chest discomfort any longer. She's not having any new shortness of breath, PND or orthopnea. She's not having any palpitations, presyncope or syncope. She is otherwise not getting this chest discomfort. She's not having any new shortness of breath, PND or orthopnea. She can be active at work without bringing on symptoms.   Past Medical History:  Diagnosis Date  . Depression    Post partum.    Marland Kitchen GERD (gastroesophageal reflux disease)   . Hypertension     Past Surgical History:  Procedure Laterality Date  . CHOLECYSTECTOMY       Current Outpatient Prescriptions  Medication Sig Dispense Refill  . buPROPion (WELLBUTRIN XL) 300 MG 24 hr tablet Take 300 mg by mouth daily.      Marland Kitchen labetalol (NORMODYNE) 300 MG tablet Take 300 mg by mouth 2 (two) times daily.    Marland Kitchen omeprazole (PRILOSEC) 20 MG capsule Take 20 mg by mouth daily.     No current facility-administered medications for this visit.     Allergies:   Review of patient's allergies indicates no  known allergies.    Social History:  The patient  reports that she has never smoked. She has never used smokeless tobacco. She reports that she does not drink alcohol or use drugs.   Family History:  The patient's family history includes Dementia in her mother; Hypertension in her father; Peripheral vascular disease in her mother; Stroke in her father.    ROS:  Please see the history of present illness.   Otherwise, review of systems are positive for none.   All other systems are reviewed and negative.    PHYSICAL EXAM: VS:  BP 120/60 (BP Location: Right Arm)   Pulse 87   Ht 5\' 8"  (1.727 m)   Wt 287 lb 9.6 oz (130.5 kg)   LMP 11/14/2015   BMI 43.73 kg/m  , BMI Body mass index is 43.73 kg/m. GENERAL:  Well appearing HEENT:  Pupils equal round and reactive, fundi not visualized, oral mucosa unremarkable NECK:  No jugular venous distention, waveform within normal limits, carotid upstroke brisk and symmetric, no bruits, no thyromegaly LYMPHATICS:  No cervical, inguinal adenopathy LUNGS:  Clear to auscultation bilaterally BACK:  No CVA tenderness CHEST:  Unremarkable HEART:  PMI not displaced or sustained,S1 and S2 within normal limits, no S3, no S4, no clicks, no rubs, no murmurs ABD:  Flat, positive bowel sounds normal  in frequency in pitch, no bruits, no rebound, no guarding, no midline pulsatile mass, no hepatomegaly, no splenomegaly EXT:  2 plus pulses throughout, no edema, no cyanosis no clubbing SKIN:  No rashes no nodules NEURO:  Cranial nerves II through XII grossly intact, motor grossly intact throughout PSYCH:  Cognitively intact, oriented to person place and time, slightly tearful appropriately.      EKG:  EKG is not ordered today. The ekg ordered 11/26/15 demonstrates sinus rhythm, rate 111, axis within normal limits, QTC slightly increased, no acute ST-T wave changes.   Recent Labs: 11/26/2015: BUN 11; Creatinine, Ser 0.83; Hemoglobin 11.7; Platelets 405; Potassium  3.5; Sodium 138    Lipid Panel No results found for: CHOL, TRIG, HDL, CHOLHDL, VLDL, LDLCALC, LDLDIRECT    Wt Readings from Last 3 Encounters:  12/10/15 287 lb 9.6 oz (130.5 kg)  12/29/13 270 lb (122.5 kg)  05/20/13 273 lb (123.8 kg)      Other studies Reviewed: Additional studies/ records that were reviewed today include: ED records. Review of the above records demonstrates:  Please see elsewhere in the note.     ASSESSMENT AND PLAN:   CHEST PAIN:    Her chest pain is atypical. However, there was a very borderline troponin elevation. POET (Plain Old Exercise Treadmill)  OBESITY:  The patient understands the need to lose weight with diet and exercise. We have discussed specific strategies for this.  We had a long discussion about this and she was quite receptive to suggestions on diet and exercise.  HTN:  Her blood pressure was slightly elevated in 1 arm and there was a differential. However, I don't strongly suspect vascular upper extremity disease or aortic disease. 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   Current medicines are reviewed at length with the patient today.  The patient does not have concerns regarding medicines.  The following changes have been made:  no change  Labs/ tests ordered today include:   Orders Placed This Encounter  Procedures  . Exercise Tolerance Test     Disposition:   FU with me as needed.      Signed, Minus Breeding, MD  12/10/2015 10:52 AM    [facility]

## 2015-12-10 ENCOUNTER — Ambulatory Visit (INDEPENDENT_AMBULATORY_CARE_PROVIDER_SITE_OTHER): Payer: BLUE CROSS/BLUE SHIELD | Admitting: Cardiology

## 2015-12-10 ENCOUNTER — Encounter: Payer: Self-pay | Admitting: Cardiology

## 2015-12-10 VITALS — BP 120/60 | HR 87 | Ht 68.0 in | Wt 287.6 lb

## 2015-12-10 DIAGNOSIS — R079 Chest pain, unspecified: Secondary | ICD-10-CM | POA: Diagnosis not present

## 2015-12-10 NOTE — Patient Instructions (Addendum)
Medication Instructions: Your physician recommends that you continue on your current medications as directed. Please refer to the Current Medication list given to you today.  Labwork: none  Testing/Procedures: Your physician has requested that you have an exercise tolerance test. For further information please visit [email or web address]. Please also follow instruction sheet, as given.  Follow-Up: As needed    Exercise Stress Electrocardiogram An exercise stress electrocardiogram is a test to check how blood flows to your heart. It is done to find areas of poor blood flow. You will need to walk on a treadmill for this test. The electrocardiogram will record your heartbeat when you are at rest and when you are exercising. BEFORE THE PROCEDURE  Do not have drinks with caffeine or foods with caffeine for 24 hours before the test, or as told by your doctor. This includes coffee, tea (even decaf tea), sodas, chocolate, and cocoa.  Follow your doctor's instructions about eating and drinking before the test.  Ask your doctor what medicines you should or should not take before the test. Take your medicines with water unless told by your doctor not to.  If you use an inhaler, bring it with you to the test.  Bring a snack to eat after the test.  Do not  smoke for 4 hours before the test.  Do not put lotions, powders, creams, or oils on your chest before the test.  Wear comfortable shoes and clothing. PROCEDURE  You will have patches put on your chest. Small areas of your chest may need to be shaved. Wires will be connected to the patches.  Your heart rate will be watched while you are resting and while you are exercising.  You will walk on the treadmill. The treadmill will slowly get faster to raise your heart rate.  The test will take about 1-2 hours. AFTER THE PROCEDURE  Your heart rate and blood pressure will be watched after the test.  You may return to your normal diet,  activities, and medicines or as told by your doctor.   This information is not intended to replace advice given to you by your health care provider. Make sure you discuss any questions you have with your health care provider.   Document Released: 07/16/2007 Document Revised: 02/17/2014 Document Reviewed: 10/04/2012 Elsevier Interactive Patient Education [employer].

## 2015-12-19 ENCOUNTER — Telehealth (HOSPITAL_COMMUNITY): Payer: Self-pay

## 2015-12-21 ENCOUNTER — Inpatient Hospital Stay (HOSPITAL_COMMUNITY): Admission: RE | Admit: 2015-12-21 | Payer: BLUE CROSS/BLUE SHIELD | Source: Ambulatory Visit

## 2016-01-21 ENCOUNTER — Telehealth (HOSPITAL_COMMUNITY): Payer: Self-pay | Admitting: Cardiology

## 2016-01-21 NOTE — Telephone Encounter (Signed)
Called pt on 11/10,11/13,and 12/11 and lmsg for her to [facility] about scheduling an ETT Dr. Warren Lacy wanted her to have. Pt has not called back as or yet and will be removing her from the workqueue. Pt will need to contact ordering physician's office to place another order  if she wants to get this scheduled.

## 2016-01-31 ENCOUNTER — Encounter (HOSPITAL_BASED_OUTPATIENT_CLINIC_OR_DEPARTMENT_OTHER): Payer: Self-pay | Admitting: *Deleted

## 2016-01-31 ENCOUNTER — Emergency Department (HOSPITAL_BASED_OUTPATIENT_CLINIC_OR_DEPARTMENT_OTHER)
Admission: EM | Admit: 2016-01-31 | Discharge: 2016-01-31 | Disposition: A | Discharge status: Home / Self Care | Payer: BLUE CROSS/BLUE SHIELD | Attending: Emergency Medicine | Admitting: Emergency Medicine

## 2016-01-31 DIAGNOSIS — H6011 Cellulitis of right external ear: Secondary | ICD-10-CM | POA: Insufficient documentation

## 2016-01-31 DIAGNOSIS — R350 Frequency of micturition: Secondary | ICD-10-CM | POA: Insufficient documentation

## 2016-01-31 DIAGNOSIS — I10 Essential (primary) hypertension: Secondary | ICD-10-CM | POA: Insufficient documentation

## 2016-01-31 DIAGNOSIS — Z79899 Other long term (current) drug therapy: Secondary | ICD-10-CM | POA: Diagnosis not present

## 2016-01-31 DIAGNOSIS — H9201 Otalgia, right ear: Secondary | ICD-10-CM | POA: Diagnosis present

## 2016-01-31 DIAGNOSIS — H60501 Unspecified acute noninfective otitis externa, right ear: Secondary | ICD-10-CM

## 2016-01-31 LAB — CBG MONITORING, ED: GLUCOSE-CAPILLARY: 105 mg/dL — AB (ref 65–99)

## 2016-01-31 MED ORDER — CIPROFLOXACIN HCL 500 MG PO TABS: 500.0000 mg | ORAL_TABLET | Freq: Two times a day (BID) | ORAL | 0 refills | Status: DC

## 2016-01-31 NOTE — ED Provider Notes (Signed)
[facility] Provider Note   CSN: [id number] Arrival date & time: 01/31/16  0702     History   Chief Complaint Chief Complaint  Patient presents with  . Otalgia    HPI Valerie Whitehead is a 40 y.o. female.  HPI Patient's had pain swelling and some drainage of her right ear for the last couple days. History of otitis externa and states this feels bad. No nausea or vomiting. No fevers. Does have some urinary frequency. She has not been on antibiotics recently. States sound does sound a little muffled in that ear. Past Medical History:  Diagnosis Date  . Depression    Post partum.    Marland Kitchen GERD (gastroesophageal reflux disease)   . Hypertension     There are no active problems to display for this patient.   Past Surgical History:  Procedure Laterality Date  . CHOLECYSTECTOMY      OB History    No data available       Home Medications    Prior to Admission medications   Medication Sig Start Date End Date Taking? Authorizing Provider  buPROPion (WELLBUTRIN XL) 300 MG 24 hr tablet Take 300 mg by mouth daily.     Yes Historical Provider, MD  labetalol (NORMODYNE) 300 MG tablet Take 300 mg by mouth 2 (two) times daily. 06/28/15  Yes Historical Provider, MD  omeprazole (PRILOSEC) 20 MG capsule Take 20 mg by mouth daily.   Yes Historical Provider, MD  ciprofloxacin (CIPRO) 500 MG tablet Take 1 tablet (500 mg total) by mouth 2 (two) times daily. 01/31/16   Davonna Belling, MD    Family History Family History  Problem Relation Age of Onset  . Dementia Mother   . Peripheral vascular disease Mother   . Stroke Father   . Hypertension Father     Social History Social History  Substance Use Topics  . Smoking status: Never Smoker  . Smokeless tobacco: Never Used  . Alcohol use No     Allergies   Patient has no known allergies.   Review of Systems Review of Systems  Constitutional: Negative for appetite change.  HENT: Positive for ear discharge and  ear pain. Negative for facial swelling, postnasal drip, sinus pressure, sore throat and trouble swallowing.   Respiratory: Negative for shortness of breath.   Cardiovascular: Negative for chest pain.  Gastrointestinal: Negative for abdominal pain.  Genitourinary: Positive for frequency.     Physical Exam Updated Vital Signs BP 137/98 (BP Location: Right Arm)   Pulse 96   Temp 98.2 F (36.8 C) (Oral)   Ht 5\' 8"  (1.727 m)   Wt 283 lb (128.4 kg)   LMP 01/29/2016   SpO2 100%   BMI 43.03 kg/m   Physical Exam  Constitutional: She appears well-developed.  Patient is obese  HENT:  Right TM normal. Some swelling of the external part of the external auditory canal. There is more swelling of the external ear without fluctuance. Slight serous drainage. No tenderness of her mastoid.  Eyes: EOM are normal.  Neck: Neck supple.  Cardiovascular: Normal rate.   Pulmonary/Chest: Effort normal.  Abdominal: Soft. There is no tenderness.  Skin: Skin is warm. Capillary refill takes less than 2 seconds.     ED Treatments / Results  Labs (all labs ordered are listed, but only abnormal results are displayed) Labs Reviewed  CBG MONITORING, ED - Abnormal; Notable for the following:       Result Value   Glucose-Capillary 105 (*)  All other components within normal limits    EKG  EKG Interpretation None       Radiology No results found.  Procedures Procedures (including critical care time)  Medications Ordered in ED Medications - No data to display   Initial Impression / Assessment and Plan / ED Course  I have reviewed the triage vital signs and the nursing notes.  Pertinent labs & imaging results that were available during my care of the patient were reviewed by me and considered in my medical decision making (see chart for details).  Clinical Course     Patient with likely otitis externa that has spread even more externally. Will treat with Cipro. No clear abscess this  time. I think an external infection in his mood internally is less likely.  Final Clinical Impressions(s) / ED Diagnoses   Final diagnoses:  Cellulitis of right external ear  Acute otitis externa of right ear, unspecified type    New Prescriptions Discharge Medication List as of 01/31/2016  7:47 AM    START taking these medications   Details  ciprofloxacin (CIPRO) 500 MG tablet Take 1 tablet (500 mg total) by mouth 2 (two) times daily., Starting Thu 01/31/2016, Print         Davonna Belling, MD 01/31/16 [phone]

## 2016-01-31 NOTE — ED Triage Notes (Signed)
C/o right ear pain and drainage and feels like swelling in ear. Decreased hearing in that ear. No fever.

## 2016-03-13 NOTE — Telephone Encounter (Signed)
Close encounter 

## 2016-06-26 ENCOUNTER — Other Ambulatory Visit: Payer: Self-pay | Admitting: *Deleted

## 2016-06-26 DIAGNOSIS — R079 Chest pain, unspecified: Secondary | ICD-10-CM

## 2016-06-27 ENCOUNTER — Emergency Department (HOSPITAL_BASED_OUTPATIENT_CLINIC_OR_DEPARTMENT_OTHER)
Admission: EM | Admit: 2016-06-27 | Discharge: 2016-06-27 | Disposition: A | Discharge status: Home / Self Care | Payer: BLUE CROSS/BLUE SHIELD | Attending: Emergency Medicine | Admitting: Emergency Medicine

## 2016-06-27 ENCOUNTER — Encounter (HOSPITAL_BASED_OUTPATIENT_CLINIC_OR_DEPARTMENT_OTHER): Payer: Self-pay | Admitting: Emergency Medicine

## 2016-06-27 DIAGNOSIS — Z79899 Other long term (current) drug therapy: Secondary | ICD-10-CM | POA: Insufficient documentation

## 2016-06-27 DIAGNOSIS — R55 Syncope and collapse: Secondary | ICD-10-CM | POA: Diagnosis present

## 2016-06-27 DIAGNOSIS — R079 Chest pain, unspecified: Secondary | ICD-10-CM

## 2016-06-27 DIAGNOSIS — I10 Essential (primary) hypertension: Secondary | ICD-10-CM | POA: Insufficient documentation

## 2016-06-27 LAB — CBC WITH DIFFERENTIAL/PLATELET
BASOS ABS: 0 10*3/uL (ref 0.0–0.1)
BASOS PCT: 0 %
Eosinophils Absolute: 0.1 10*3/uL (ref 0.0–0.7)
Eosinophils Relative: 2 %
HEMATOCRIT: 33.5 % — AB (ref 36.0–46.0)
HEMOGLOBIN: 10.8 g/dL — AB (ref 12.0–15.0)
Lymphocytes Relative: 24 %
Lymphs Abs: 1.8 10*3/uL (ref 0.7–4.0)
MCH: 26.3 pg (ref 26.0–34.0)
MCHC: 32.2 g/dL (ref 30.0–36.0)
MCV: 81.5 fL (ref 78.0–100.0)
Monocytes Absolute: 0.5 10*3/uL (ref 0.1–1.0)
Monocytes Relative: 6 %
NEUTROS ABS: 5.2 10*3/uL (ref 1.7–7.7)
NEUTROS PCT: 68 %
Platelets: 390 10*3/uL (ref 150–400)
RBC: 4.11 MIL/uL (ref 3.87–5.11)
RDW: 15.1 % (ref 11.5–15.5)
WBC: 7.6 10*3/uL (ref 4.0–10.5)

## 2016-06-27 LAB — URINALYSIS, MICROSCOPIC (REFLEX)

## 2016-06-27 LAB — COMPREHENSIVE METABOLIC PANEL
ALBUMIN: 3.2 g/dL — AB (ref 3.5–5.0)
ALT: 26 U/L (ref 14–54)
AST: 25 U/L (ref 15–41)
Alkaline Phosphatase: 81 U/L (ref 38–126)
Anion gap: 10 (ref 5–15)
BILIRUBIN TOTAL: 0.2 mg/dL — AB (ref 0.3–1.2)
BUN: 12 mg/dL (ref 6–20)
CO2: 24 mmol/L (ref 22–32)
Calcium: 8.9 mg/dL (ref 8.9–10.3)
Chloride: 105 mmol/L (ref 101–111)
Creatinine, Ser: 0.92 mg/dL (ref 0.44–1.00)
GFR calc Af Amer: >60 mL/min (ref >60)
GFR calc non Af Amer: >60 mL/min (ref >60)
GLUCOSE: 112 mg/dL — AB (ref 65–99)
POTASSIUM: 3.3 mmol/L — AB (ref 3.5–5.1)
Sodium: 139 mmol/L (ref 135–145)
TOTAL PROTEIN: 6.9 g/dL (ref 6.5–8.1)

## 2016-06-27 LAB — URINALYSIS, ROUTINE W REFLEX MICROSCOPIC
Bilirubin Urine: NEGATIVE
Glucose, UA: NEGATIVE mg/dL
Hgb urine dipstick: NEGATIVE
Ketones, ur: NEGATIVE mg/dL
LEUKOCYTES UA: NEGATIVE
Nitrite: NEGATIVE
PROTEIN: 100 mg/dL — AB
Specific Gravity, Urine: 1.03 (ref 1.005–1.030)
pH: 5.5 (ref 5.0–8.0)

## 2016-06-27 LAB — PREGNANCY, URINE: PREG TEST UR: NEGATIVE

## 2016-06-27 LAB — D-DIMER, QUANTITATIVE (NOT AT ARMC): D DIMER QUANT: 0.37 ug{FEU}/mL (ref 0.00–0.50)

## 2016-06-27 LAB — TROPONIN I: Troponin I: <0.03 ng/mL (ref <0.03)

## 2016-06-27 MED ORDER — ASPIRIN 81 MG PO CHEW
324.0000 mg | CHEWABLE_TABLET | Freq: Once | ORAL | Status: AC
  Administered 2016-06-27: 324 mg via ORAL
  Filled 2016-06-27: qty 4

## 2016-06-27 MED ORDER — LABETALOL HCL 300 MG PO TABS
300.0000 mg | ORAL_TABLET | Freq: Once | ORAL | Status: DC
Start: 2016-06-27 — End: 2016-06-27
  Filled 2016-06-27: qty 1

## 2016-06-27 NOTE — ED Triage Notes (Signed)
Patient states that she was out shopping and became dizzy and lighthead. She also reports that she has had chest pain x 2 -3 day intermittently

## 2016-06-27 NOTE — ED Provider Notes (Signed)
[facility] Provider Note   CSN: [id number] Arrival date & time: 06/27/16  1925  By signing my name below, I, Valerie Whitehead, attest that this documentation has been prepared under the direction and in the presence of Charlesetta Shanks, MD. Electronically Signed: Reola Whitehead, ED Scribe. 06/27/16. 10:12 PM.  History   Chief Complaint Chief Complaint  Patient presents with  . Near Syncope   The history is provided by the patient. No language interpreter was used.    HPI Comments: Valerie Whitehead is a 41 y.o. female with a PMHx of HTN on Labetalol BID, who presents to the Emergency Department complaining of sudden onset, resolved episode of near-syncope beginning several hours ago. Per pt, she was about to leave [employer] after shopping today when she suddenly became near-syncopal. At that time she leaned against a wall for several minutes until her symptoms resolved, and she denies any chest pain or shortness of breath during this. She was feeling at her baseline and was asymptomatic prior to this. No h/o similar episodes or syncope. Per pt, additionally she has experienced 2-3 days of intermittent, mild left lateral chest pain. She reports that she has experienced similar pain in the past and has previously been seen in the ED for this with a benign workup (~7 months ago). She also has a nuclear stress test scheduled for one week from now. Pt also notes that she frequently lifts heavy objects for works. Additionally, she states that she has experienced mild bilateral leg swelling over the past 2-3 months which is being monitored by her PCP. No FHx of cardiac issues/events. No h/o or FHx PE/DVT, recent long travel, surgery, fracture, prolonged immobilization, or exogenous estrogen usage. She is a nonsmoker. Pt denies any illicit drug usage. No recent illnesses/infections. She was compliant with her HTN medications today. She denies fever, chills, nausea, vomiting, or any other  associated symptoms.   Past Medical History:  Diagnosis Date  . Depression    Post partum.    Marland Kitchen GERD (gastroesophageal reflux disease)   . Hypertension    There are no active problems to display for this patient.  Past Surgical History:  Procedure Laterality Date  . CHOLECYSTECTOMY     OB History    No data available     Home Medications    Prior to Admission medications   Medication Sig Start Date End Date Taking? Authorizing Provider  buPROPion (WELLBUTRIN XL) 300 MG 24 hr tablet Take 300 mg by mouth daily.      [provider]  ciprofloxacin (CIPRO) 500 MG tablet Take 1 tablet (500 mg total) by mouth 2 (two) times daily. 01/31/16   Davonna Belling, MD  labetalol (NORMODYNE) 300 MG tablet Take 300 mg by mouth 2 (two) times daily. 06/28/15   [provider]  omeprazole (PRILOSEC) 20 MG capsule Take 20 mg by mouth daily.    [provider]   Family History Family History  Problem Relation Age of Onset  . Dementia Mother   . Peripheral vascular disease Mother   . Stroke Father   . Hypertension Father    Social History Social History  Substance Use Topics  . Smoking status: Never Smoker  . Smokeless tobacco: Never Used  . Alcohol use No   Allergies   Patient has no known allergies.  Review of Systems Review of Systems A complete review of systems was obtained and all systems are negative except as noted in the HPI and PMH.  Physical Exam Updated Vital Signs BP 126/78 (BP Location: Left Arm)   Pulse 86   Temp 98.5 F (36.9 C) (Oral)   Resp 20   Ht 5\' 8"  (1.727 m)   Wt 283 lb (128.4 kg)   LMP 06/13/2016   SpO2 100%   BMI 43.03 kg/m   Physical Exam  Constitutional: She appears well-developed and well-nourished. No distress.  HENT:  Head: Normocephalic and atraumatic.  Right Ear: External ear normal.  Left Ear: External ear normal.  Nose: Nose normal.  Mouth/Throat: Oropharynx is clear and moist. No oropharyngeal exudate.    Eyes: Conjunctivae are normal.  Neck: Normal range of motion.  Cardiovascular: Regular rhythm and normal heart sounds.  Exam reveals no gallop and no friction rub.   No murmur heard. Borderline tachycardic.   Pulmonary/Chest: Effort normal and breath sounds normal. No respiratory distress. She has no wheezes. She has no rales.  Abdominal: Soft. She exhibits no distension. There is no tenderness.  Musculoskeletal: Normal range of motion. She exhibits no edema or tenderness.  No appreciable BLE edema. No BLE tenderness.   Neurological: She is alert.  Skin: No pallor.  Psychiatric: She has a normal mood and affect. Her behavior is normal.  Nursing note and vitals reviewed.  ED Treatments / Results  DIAGNOSTIC STUDIES: Oxygen Saturation is 100% on RA, normal by my interpretation.   COORDINATION OF CARE: 10:12 PM-Discussed next steps with pt. Pt verbalized understanding and is agreeable with the plan.   Labs (all labs ordered are listed, but only abnormal results are displayed) Labs Reviewed  URINALYSIS, ROUTINE W REFLEX MICROSCOPIC - Abnormal; Notable for the following:       Result Value   Protein, ur 100 (*)    All other components within normal limits  CBC WITH DIFFERENTIAL/PLATELET - Abnormal; Notable for the following:    Hemoglobin 10.8 (*)    HCT 33.5 (*)    All other components within normal limits  COMPREHENSIVE METABOLIC PANEL - Abnormal; Notable for the following:    Potassium 3.3 (*)    Glucose, Bld 112 (*)    Albumin 3.2 (*)    Total Bilirubin 0.2 (*)    All other components within normal limits  URINALYSIS, MICROSCOPIC (REFLEX) - Abnormal; Notable for the following:    Bacteria, UA FEW (*)    Squamous Epithelial / LPF 6-30 (*)    All other components within normal limits  PREGNANCY, URINE  TROPONIN I  D-DIMER, QUANTITATIVE (NOT AT [facility])   EKG  EKG Interpretation  Date/Time:  Friday Jun 27 2016 19:34:04 EDT Ventricular Rate:  100 PR Interval:  164 QRS  Duration: 92 QT Interval:  376 QTC Calculation: 485 R Axis:   66 Text Interpretation:  Normal sinus rhythm Prolonged QT Abnormal ECG Confirmed by Charlesetta Shanks [phone]) on 06/27/2016 8:31:35 PM      Radiology No results found.  Procedures Procedures   Medications Ordered in ED Medications  labetalol (NORMODYNE) tablet 300 mg (not administered)  aspirin chewable tablet 324 mg (324 mg Oral Given 06/27/16 2218)    Initial Impression / Assessment and Plan / ED Course  I have reviewed the triage vital signs and the nursing notes.  Pertinent labs & imaging results that were available during my care of the patient were reviewed by me and considered in my medical decision making (see chart for details).     Final Clinical Impressions(s) / ED Diagnoses   Final diagnoses:  Near syncope  Chest pain,  unspecified type  The patient has had several days of local left upper chest pain that comes and goes. 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 She does not have pleuritic associated symptoms or dyspnea that is significantly suggestive of PE. D-dimer is not elevated. Patient does not have other PE risk factors, negative family history, negative personal history, no recent immobilization, surgery or birth control. Symptoms are not highly suggestive of ischemic type pain. This is not exclusively exertional or with radiation or associated dyspnea. Patient did have chest pain last year in October was supposed to get follow-up exercise stress test. She had she has this scheduled for next week. I feel she is stable to continue outpatient care and return precautions are reviewed. She otherwise will follow-up with her PCP and outpatient stress testing. New Prescriptions New Prescriptions   No medications on file       Charlesetta Shanks, MD 06/28/16 [phone]

## 2016-08-19 ENCOUNTER — Telehealth (HOSPITAL_COMMUNITY): Payer: Self-pay | Admitting: Cardiology

## 2016-08-19 NOTE — Telephone Encounter (Signed)
User: Cherie Dark A Date/time: 07/22/2016 2:36 PM  Comment: Called pt and lmsg for pt to CB to r/s ETT cxed on 5/25 and 07/18/16.  Context: [place] Schedule Orders/Appt Requests Outcome: Left Message  Phone number: [phone] Phone Type: Home Phone  Comm. type: Telephone Call type: Outgoing  Contact: Rozell Searing Relation to patient: Self  Letter:            Questions    Where should this test be performed [facility]

## 2016-08-21 ENCOUNTER — Telehealth: Payer: Self-pay | Admitting: *Deleted

## 2016-08-21 ENCOUNTER — Other Ambulatory Visit: Payer: Self-pay | Admitting: *Deleted

## 2016-08-21 NOTE — Telephone Encounter (Signed)
Received message from Zigmund Daniel stated pt need to reschedule her stress test, pt cancel stress test 3 times late last year, pt went to ED in May for near syncope, spoke with Almyra Deforest, PA to see if I should go ahead and reschedule or do pt need to come in for F/U visit he stated that pt should schedule f/u visit. Leave message on pt home phone to call office to schedule OV

## 2016-09-02 DIAGNOSIS — N920 Excessive and frequent menstruation with regular cycle: Secondary | ICD-10-CM

## 2016-09-02 HISTORY — DX: Excessive and frequent menstruation with regular cycle: N92.0

## 2016-09-04 ENCOUNTER — Telehealth: Payer: Self-pay | Admitting: Cardiology

## 2016-09-04 NOTE — Telephone Encounter (Signed)
New Message     Pt is ready to have Exercise Tolerance Test schedule but the order has expired, please call

## 2016-09-04 NOTE — Telephone Encounter (Signed)
Called patient and informed her that I could have the test rescheduled, but she must show up for the test. She voiced understanding and said she would do her best to make her appointment.   I will have a scheduler contact her to reschedule her appointment. Patient voiced understanding.

## 2016-09-04 NOTE — Telephone Encounter (Signed)
Follow up    Pt is asking for a call about this. Please call.

## 2016-09-10 ENCOUNTER — Telehealth (HOSPITAL_COMMUNITY): Payer: Self-pay

## 2016-09-10 NOTE — Telephone Encounter (Signed)
Encounter complete. 

## 2016-09-12 ENCOUNTER — Ambulatory Visit (HOSPITAL_COMMUNITY)
Admission: RE | Admit: 2016-09-12 | Payer: BLUE CROSS/BLUE SHIELD | Source: Ambulatory Visit | Attending: Cardiology | Admitting: Cardiology

## 2016-09-15 ENCOUNTER — Telehealth (HOSPITAL_COMMUNITY): Payer: Self-pay | Admitting: Cardiology

## 2016-09-15 NOTE — Telephone Encounter (Signed)
User: Valerie Whitehead A Date/time: 07/22/2016 2:36 PM  Comment: Called pt and lmsg for pt to CB to r/s ETT cxed on 5/25 and 07/18/16.  Context: [place] Schedule Orders/Appt Requests Outcome: Left Message  Phone number: [phone] Phone Type: Home Phone  Comm. type: Telephone Call type: Outgoing  Contact: Rozell Searing Relation to patient: Self  Letter:       Patient cancelled appts on 5/25,6/8, and 8/3. She will be removed from the workqueue.

## 2017-06-24 ENCOUNTER — Other Ambulatory Visit: Payer: Self-pay

## 2017-06-24 ENCOUNTER — Encounter (HOSPITAL_BASED_OUTPATIENT_CLINIC_OR_DEPARTMENT_OTHER): Payer: Self-pay

## 2017-06-24 ENCOUNTER — Emergency Department (HOSPITAL_BASED_OUTPATIENT_CLINIC_OR_DEPARTMENT_OTHER)
Admission: EM | Admit: 2017-06-24 | Discharge: 2017-06-24 | Disposition: A | Discharge status: Home / Self Care | Payer: BLUE CROSS/BLUE SHIELD | Attending: Physician Assistant | Admitting: Physician Assistant

## 2017-06-24 ENCOUNTER — Emergency Department (HOSPITAL_BASED_OUTPATIENT_CLINIC_OR_DEPARTMENT_OTHER): Payer: BLUE CROSS/BLUE SHIELD

## 2017-06-24 DIAGNOSIS — M79602 Pain in left arm: Secondary | ICD-10-CM

## 2017-06-24 DIAGNOSIS — M549 Dorsalgia, unspecified: Secondary | ICD-10-CM | POA: Diagnosis present

## 2017-06-24 DIAGNOSIS — Z79899 Other long term (current) drug therapy: Secondary | ICD-10-CM | POA: Insufficient documentation

## 2017-06-24 DIAGNOSIS — I10 Essential (primary) hypertension: Secondary | ICD-10-CM | POA: Diagnosis not present

## 2017-06-24 LAB — BASIC METABOLIC PANEL
Anion gap: 8 (ref 5–15)
BUN: 12 mg/dL (ref 6–20)
CO2: 24 mmol/L (ref 22–32)
CREATININE: 0.92 mg/dL (ref 0.44–1.00)
Calcium: 8.8 mg/dL — ABNORMAL LOW (ref 8.9–10.3)
Chloride: 104 mmol/L (ref 101–111)
GFR calc Af Amer: >60 mL/min (ref >60)
GLUCOSE: 92 mg/dL (ref 65–99)
POTASSIUM: 3.7 mmol/L (ref 3.5–5.1)
SODIUM: 136 mmol/L (ref 135–145)

## 2017-06-24 LAB — CBC WITH DIFFERENTIAL/PLATELET
BASOS PCT: 0 %
Basophils Absolute: 0 10*3/uL (ref 0.0–0.1)
EOS ABS: 0.1 10*3/uL (ref 0.0–0.7)
EOS PCT: 2 %
HCT: 33.2 % — ABNORMAL LOW (ref 36.0–46.0)
Hemoglobin: 10.8 g/dL — ABNORMAL LOW (ref 12.0–15.0)
LYMPHS ABS: 1.9 10*3/uL (ref 0.7–4.0)
Lymphocytes Relative: 27 %
MCH: 26.2 pg (ref 26.0–34.0)
MCHC: 32.5 g/dL (ref 30.0–36.0)
MCV: 80.6 fL (ref 78.0–100.0)
Monocytes Absolute: 0.6 10*3/uL (ref 0.1–1.0)
Monocytes Relative: 8 %
Neutro Abs: 4.5 10*3/uL (ref 1.7–7.7)
Neutrophils Relative %: 63 %
Platelets: 383 10*3/uL (ref 150–400)
RBC: 4.12 MIL/uL (ref 3.87–5.11)
RDW: 15.3 % (ref 11.5–15.5)
WBC: 7 10*3/uL (ref 4.0–10.5)

## 2017-06-24 LAB — TROPONIN I

## 2017-06-24 MED ORDER — CYCLOBENZAPRINE HCL 10 MG PO TABS: 10.0000 mg | ORAL_TABLET | Freq: Every evening | ORAL | 0 refills | Status: DC | PRN

## 2017-06-24 NOTE — ED Notes (Signed)
Updated pt to results and plan of care

## 2017-06-24 NOTE — Discharge Instructions (Addendum)
Please read instructions below.  Apply ice or heat to your areas of pain.  You can take Flexeril/cyclobenzaprine  at bedtime for muscle spasm.be aware this medication can make you drowsy.  Do not drive or drink alcohol with it. Take ibuprofen/Advil every 6 hours as needed for pain. Follow-up with your primary care provider if symptoms persist. Return to the ER for new or worsening symptoms; including worsening chest pain, shortness of breath, pain that radiates to the arm or neck, pain or shortness of breath worsened with exertion.

## 2017-06-24 NOTE — ED Notes (Signed)
Patient transported to X-ray 

## 2017-06-24 NOTE — ED Triage Notes (Signed)
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

## 2017-06-24 NOTE — ED Provider Notes (Signed)
[facility] EM[facility] DEPARTMENT Provider Note   CSN: [id number] Arrival date & time: 06/24/17  1637     History   Chief Complaint Chief Complaint  Patient presents with  . Back Pain    HPI Valerie Whitehead is a 42 y.o. female with past medical history hypertension, GERD, presented to the ED was 2 days of last sided upper back and arm pain that is worse with movement and lifting.  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  Patient took Tylenol last night for symptoms.  She states today she had an episode of left-sided sharp chest pain while at rest it lasted 3 to 4 minutes.  Pain occurred at 1 PM and resolved without intervention.  She denies associated nausea, diaphoresis,shortness of breath or other associated symptoms.  No cardiac history.  History of DVT/PE, no exogenous estrogen use, no recent prolonged travel or trauma, no leg swelling or pain.  The history is provided by the patient.    Past Medical History:  Diagnosis Date  . Depression    Post partum.    Marland Kitchen GERD (gastroesophageal reflux disease)   . Hypertension     There are no active problems to display for this patient.   Past Surgical History:  Procedure Laterality Date  . CHOLECYSTECTOMY       OB History   None      Home Medications    Prior to Admission medications   Medication Sig Start Date End Date Taking? Authorizing Provider  buPROPion (WELLBUTRIN XL) 300 MG 24 hr tablet Take 300 mg by mouth daily.      [provider]  ciprofloxacin (CIPRO) 500 MG tablet Take 1 tablet (500 mg total) by mouth 2 (two) times daily. 01/31/16   Davonna Belling, MD  cyclobenzaprine (FLEXERIL) 10 MG tablet Take 1 tablet (10 mg total) by mouth at bedtime as needed for muscle spasms. 06/24/17   Artie Mcintyre, [place] N, PA-C  labetalol (NORMODYNE) 300 MG tablet Take 300 mg by mouth 2 (two) times daily. 06/28/15   [provider]  omeprazole (PRILOSEC) 20 MG  capsule Take 20 mg by mouth daily.    [provider]    Family History Family History  Problem Relation Age of Onset  . Dementia Mother   . Peripheral vascular disease Mother   . Stroke Father   . Hypertension Father     Social History Social History   Tobacco Use  . Smoking status: Never Smoker  . Smokeless tobacco: Never Used  Substance Use Topics  . Alcohol use: No  . Drug use: No     Allergies   Patient has no known allergies.   Review of Systems Review of Systems  Constitutional: Negative for diaphoresis.  Respiratory: Negative for shortness of breath.   Cardiovascular: Positive for chest pain. Negative for palpitations and leg swelling.  Gastrointestinal: Negative for nausea.  Musculoskeletal: Positive for back pain and myalgias.  All other systems reviewed and are negative.    Physical Exam Updated Vital Signs BP 135/86   Pulse 84   Temp 98.9 F (37.2 C) (Oral)   Resp (!) 22   Ht 5\' 8"  (1.727 m)   Wt 130 kg (286 lb 9.6 oz)   LMP 06/12/2017   SpO2 99%   BMI 43.58 kg/m   Physical Exam  Constitutional: She appears well-developed and well-nourished. No distress.  HENT:  Head: Normocephalic and atraumatic.  Eyes: Conjunctivae are normal.  Neck:  Normal range of motion. Neck supple. No JVD present. No tracheal deviation present.  Cardiovascular: Normal rate, regular rhythm, normal heart sounds and intact distal pulses.  Pulmonary/Chest: Effort normal and breath sounds normal. No stridor. No respiratory distress. She has no wheezes. She has no rales. She exhibits no tenderness.  Abdominal: Soft. Bowel sounds are normal. She exhibits no distension. There is no tenderness.  Musculoskeletal:  No lower extremity swelling or tenderness. Very mild tenderness to left lower trapezius muscle group.  No midline spinal or paraspinal tenderness.  Pain with resistive flexion and extension of L elbow.  5/5 bilateral grip strength.  Normal sensation.    Neurological: She is alert.  Skin: Skin is warm.  Psychiatric: She has a normal mood and affect. Her behavior is normal.  Nursing note and vitals reviewed.    ED Treatments / Results  Labs (all labs ordered are listed, but only abnormal results are displayed) Labs Reviewed  CBC WITH DIFFERENTIAL/PLATELET - Abnormal; Notable for the following components:      Result Value   Hemoglobin 10.8 (*)    HCT 33.2 (*)    All other components within normal limits  BASIC METABOLIC PANEL - Abnormal; Notable for the following components:   Calcium 8.8 (*)    All other components within normal limits  TROPONIN I    EKG EKG Interpretation  Date/Time:  Wednesday Jun 24 2017 17:36:34 EDT Ventricular Rate:  88 PR Interval:    QRS Duration: 98 QT Interval:  371 QTC Calculation: 449 R Axis:   83 Text Interpretation:  Sinus rhythm Normal sinus rhythm Confirmed by Thomasene Lot, [place] [phone]) on 06/24/2017 7:01:24 PM   Radiology Dg Chest 2 View  Result Date: 06/24/2017 CLINICAL DATA:  Chest pain EXAM: CHEST - 2 VIEW COMPARISON:  11/26/2015 FINDINGS: The heart size and mediastinal contours are within normal limits. Both lungs are clear. The visualized skeletal structures are unremarkable. IMPRESSION: No active cardiopulmonary disease. Electronically Signed   By: Donavan Foil M.D.   On: 06/24/2017 18:21    Procedures Procedures (including critical care time)  Medications Ordered in ED Medications - No data to display   Initial Impression / Assessment and Plan / ED Course  I have reviewed the triage vital signs and the nursing notes.  Pertinent labs & imaging results that were available during my care of the patient were reviewed by me and considered in my medical decision making (see chart for details).     Pt presenting with left upper back and arm pain x 2days, with 1 episode of CP at 1pm. Chest pain is not likely of cardiac or pulmonary etiology d/t presentation, PERC negative, VSS,  no tracheal deviation, no JVD or new murmur, RRR, breath sounds equal bilaterally, EKG without acute abnormalities, negative troponin, and negative CXR. Do not feel need to delta troponin given onset of chest pain was 5 hours prior to patient's presentation and no reported CP since that time.  I suspect symptoms are musculoskeletal, given location of pain, its reproducible, and patient's daily work involves lifting.  Discussed symptomatic management, Rx for Flexeril.  Patient is to be discharged with recommendation to follow up with PCP in regards to today's hospital visit. Pt has been advised to return to the ED if CP becomes exertional, associated with diaphoresis or nausea, radiates to left jaw/arm, worsens or becomes concerning in any way. Pt appears reliable for follow up and is agreeable to discharge.   Patient discussed with Dr. Thomasene Lot, who agrees  with workup and discharge.  Discussed results, findings, treatment and follow up. Patient advised of return precautions. Patient verbalized understanding and agreed with plan.   Final Clinical Impressions(s) / ED Diagnoses   Final diagnoses:  Upper back pain on left side  Left arm pain    ED Discharge Orders        Ordered    cyclobenzaprine (FLEXERIL) 10 MG tablet  At bedtime PRN     06/24/17 1906       Koralynn Greenspan, [place] N, PA-C 06/24/17 1913    Macarthur Critchley, MD 06/24/17 2328

## 2017-09-14 DIAGNOSIS — E669 Obesity, unspecified: Secondary | ICD-10-CM | POA: Insufficient documentation

## 2017-09-14 DIAGNOSIS — D259 Leiomyoma of uterus, unspecified: Secondary | ICD-10-CM

## 2017-09-14 DIAGNOSIS — G5603 Carpal tunnel syndrome, bilateral upper limbs: Secondary | ICD-10-CM

## 2017-09-14 DIAGNOSIS — I10 Essential (primary) hypertension: Secondary | ICD-10-CM | POA: Insufficient documentation

## 2017-09-14 DIAGNOSIS — E782 Mixed hyperlipidemia: Secondary | ICD-10-CM | POA: Insufficient documentation

## 2017-09-14 DIAGNOSIS — D509 Iron deficiency anemia, unspecified: Secondary | ICD-10-CM | POA: Insufficient documentation

## 2017-09-14 DIAGNOSIS — F3341 Major depressive disorder, recurrent, in partial remission: Secondary | ICD-10-CM | POA: Insufficient documentation

## 2017-09-14 HISTORY — DX: Iron deficiency anemia, unspecified: D50.9

## 2017-09-14 HISTORY — DX: Essential (primary) hypertension: I10

## 2017-09-14 HISTORY — DX: Leiomyoma of uterus, unspecified: D25.9

## 2017-09-14 HISTORY — DX: Mixed hyperlipidemia: E78.2

## 2017-09-14 HISTORY — DX: Carpal tunnel syndrome, bilateral upper limbs: G56.03

## 2017-09-14 HISTORY — DX: Obesity, unspecified: E66.9

## 2017-09-14 HISTORY — DX: Major depressive disorder, recurrent, in partial remission: F33.41

## 2018-01-07 ENCOUNTER — Other Ambulatory Visit: Payer: Self-pay

## 2018-01-07 ENCOUNTER — Emergency Department (HOSPITAL_BASED_OUTPATIENT_CLINIC_OR_DEPARTMENT_OTHER)
Admission: EM | Admit: 2018-01-07 | Discharge: 2018-01-07 | Disposition: A | Discharge status: Home / Self Care | Payer: BLUE CROSS/BLUE SHIELD | Attending: Emergency Medicine | Admitting: Emergency Medicine

## 2018-01-07 ENCOUNTER — Emergency Department (HOSPITAL_BASED_OUTPATIENT_CLINIC_OR_DEPARTMENT_OTHER): Payer: BLUE CROSS/BLUE SHIELD

## 2018-01-07 ENCOUNTER — Encounter (HOSPITAL_BASED_OUTPATIENT_CLINIC_OR_DEPARTMENT_OTHER): Payer: Self-pay | Admitting: *Deleted

## 2018-01-07 DIAGNOSIS — Z79899 Other long term (current) drug therapy: Secondary | ICD-10-CM | POA: Insufficient documentation

## 2018-01-07 DIAGNOSIS — I10 Essential (primary) hypertension: Secondary | ICD-10-CM | POA: Insufficient documentation

## 2018-01-07 DIAGNOSIS — R000 Tachycardia, unspecified: Secondary | ICD-10-CM | POA: Diagnosis not present

## 2018-01-07 LAB — COMPREHENSIVE METABOLIC PANEL
ALT: 27 U/L (ref 0–44)
AST: 20 U/L (ref 15–41)
Albumin: 3.8 g/dL (ref 3.5–5.0)
Alkaline Phosphatase: 77 U/L (ref 38–126)
Anion gap: 8 (ref 5–15)
BUN: 17 mg/dL (ref 6–20)
CO2: 26 mmol/L (ref 22–32)
Calcium: 9.5 mg/dL (ref 8.9–10.3)
Chloride: 104 mmol/L (ref 98–111)
Creatinine, Ser: 1.26 mg/dL — ABNORMAL HIGH (ref 0.44–1.00)
GFR calc Af Amer: >60 mL/min (ref >60)
GFR calc non Af Amer: 53 mL/min — ABNORMAL LOW (ref >60)
Glucose, Bld: 111 mg/dL — ABNORMAL HIGH (ref 70–99)
Potassium: 3.1 mmol/L — ABNORMAL LOW (ref 3.5–5.1)
SODIUM: 138 mmol/L (ref 135–145)
Total Bilirubin: 0.5 mg/dL (ref 0.3–1.2)
Total Protein: 7.5 g/dL (ref 6.5–8.1)

## 2018-01-07 LAB — CBC WITH DIFFERENTIAL/PLATELET
ABS IMMATURE GRANULOCYTES: 0.03 10*3/uL (ref 0.00–0.07)
Basophils Absolute: 0.1 10*3/uL (ref 0.0–0.1)
Basophils Relative: 1 %
Eosinophils Absolute: 0.1 10*3/uL (ref 0.0–0.5)
Eosinophils Relative: 1 %
HCT: 43.5 % (ref 36.0–46.0)
Hemoglobin: 13.5 g/dL (ref 12.0–15.0)
IMMATURE GRANULOCYTES: 0 %
Lymphocytes Relative: 21 %
Lymphs Abs: 2 10*3/uL (ref 0.7–4.0)
MCH: 27.2 pg (ref 26.0–34.0)
MCHC: 31 g/dL (ref 30.0–36.0)
MCV: 87.5 fL (ref 80.0–100.0)
MONOS PCT: 7 %
Monocytes Absolute: 0.7 10*3/uL (ref 0.1–1.0)
NEUTROS PCT: 70 %
Neutro Abs: 6.5 10*3/uL (ref 1.7–7.7)
Platelets: 425 10*3/uL — ABNORMAL HIGH (ref 150–400)
RBC: 4.97 MIL/uL (ref 3.87–5.11)
RDW: 15 % (ref 11.5–15.5)
WBC: 9.3 10*3/uL (ref 4.0–10.5)
nRBC: 0 % (ref 0.0–0.2)

## 2018-01-07 LAB — RAPID URINE DRUG SCREEN, HOSP PERFORMED
Amphetamines: NOT DETECTED
BENZODIAZEPINES: NOT DETECTED
Barbiturates: NOT DETECTED
Cocaine: NOT DETECTED
Opiates: NOT DETECTED
Tetrahydrocannabinol: NOT DETECTED

## 2018-01-07 LAB — TSH: TSH: 1.343 u[IU]/mL (ref 0.350–4.500)

## 2018-01-07 LAB — D-DIMER, QUANTITATIVE: D-Dimer, Quant: 0.53 ug/mL-FEU — ABNORMAL HIGH (ref 0.00–0.50)

## 2018-01-07 LAB — PREGNANCY, URINE: Preg Test, Ur: NEGATIVE

## 2018-01-07 MED ORDER — METOPROLOL SUCCINATE ER 25 MG PO TB24: 12.5000 mg | ORAL_TABLET | Freq: Every day | ORAL | 0 refills | Status: DC

## 2018-01-07 MED ORDER — POTASSIUM CHLORIDE CRYS ER 20 MEQ PO TBCR
40.0000 meq | EXTENDED_RELEASE_TABLET | Freq: Once | ORAL | Status: AC
  Administered 2018-01-07: 40 meq via ORAL
  Filled 2018-01-07: qty 2

## 2018-01-07 MED ORDER — SODIUM CHLORIDE 0.9 % IV BOLUS
1000.0000 mL | Freq: Once | INTRAVENOUS | Status: AC
  Administered 2018-01-07: 1000 mL via INTRAVENOUS

## 2018-01-07 MED ORDER — IOPAMIDOL (ISOVUE-370) INJECTION 76%
100.0000 mL | Freq: Once | INTRAVENOUS | Status: AC | PRN
  Administered 2018-01-07: 100 mL via INTRAVENOUS

## 2018-01-07 NOTE — ED Provider Notes (Signed)
[facility] EMERGENCY DEPARTMENT Provider Note   CSN: [id number] Arrival date & time: 01/07/18  1807     History   Chief Complaint Chief Complaint  Patient presents with  . Tachycardia    HPI Valerie Whitehead is a 42 y.o. female.  61-year-old female with possible history including hypertension, GERD, depression who presents with tachycardia.  Patient notes that for the past 2 days she has had relatively constant heart racing with elevated heart rate.  She notes that it all began after her PCP changed her blood pressure medications from labetalol times daily to amlodipine, hydrochlorothiazide, and losartan.  She denies any associated chest pain, shortness of breath, lightheadedness, fevers, or recent illness although she notes she did have one episode of vomiting this evening after Thanksgiving dinner.  Currently she denies any other complaints.  No abdominal pain.  No excessive caffeine use.  No significant alcohol use or drug use.  No recent travel, history of blood clots, history of cancer, OCP use, or leg swelling/pain.  The history is provided by the patient.    Past Medical History:  Diagnosis Date  . Depression    Post partum.    Marland Kitchen GERD (gastroesophageal reflux disease)   . Hypertension     There are no active problems to display for this patient.   Past Surgical History:  Procedure Laterality Date  . CHOLECYSTECTOMY       OB History   None      Home Medications    Prior to Admission medications   Medication Sig Start Date End Date Taking? Authorizing Provider  AMLODIPINE BENZOATE PO Take by mouth.   Yes [provider]  buPROPion (WELLBUTRIN XL) 300 MG 24 hr tablet Take 300 mg by mouth daily.     Yes [provider]  hydrochlorothiazide (HYDRODIURIL) 12.5 MG tablet Take 12.5 mg by mouth daily.   Yes [provider]  losartan (COZAAR) 25 MG tablet Take 25 mg by mouth daily.   Yes [provider]  omeprazole  (PRILOSEC) 20 MG capsule Take 20 mg by mouth daily.   Yes [provider]  ciprofloxacin (CIPRO) 500 MG tablet Take 1 tablet (500 mg total) by mouth 2 (two) times daily. 01/31/16   Davonna Belling, MD  cyclobenzaprine (FLEXERIL) 10 MG tablet Take 1 tablet (10 mg total) by mouth at bedtime as needed for muscle spasms. 06/24/17   Robinson, [place] N, PA-C  labetalol (NORMODYNE) 300 MG tablet Take 300 mg by mouth 2 (two) times daily. 06/28/15   [provider]  metoprolol succinate (TOPROL XL) 25 MG 24 hr tablet Take 0.5 tablets (12.5 mg total) by mouth daily. 01/07/18   Little, Wenda Overland, MD    Family History Family History  Problem Relation Age of Onset  . Dementia Mother   . Peripheral vascular disease Mother   . Stroke Father   . Hypertension Father     Social History Social History   Tobacco Use  . Smoking status: Never Smoker  . Smokeless tobacco: Never Used  Substance Use Topics  . Alcohol use: No  . Drug use: No     Allergies   Patient has no known allergies.   Review of Systems Review of Systems All other systems reviewed and are negative except that which was mentioned in HPI   Physical Exam Updated Vital Signs BP 119/68   Pulse (!) 111   Temp 98.2 F (36.8 C) (Oral)   Resp (!) 24   Ht  5\' 8"  (1.727 m)   Wt 123.8 kg   LMP 12/15/2017   SpO2 100%   BMI 41.51 kg/m   Physical Exam  Constitutional: She is oriented to person, place, and time. She appears well-developed and well-nourished. No distress.  HENT:  Head: Normocephalic and atraumatic.  Moist mucous membranes  Eyes: Conjunctivae are normal.  Neck: Neck supple.  Cardiovascular: Regular rhythm and normal heart sounds. Tachycardia present.  No murmur heard. Pulmonary/Chest: Effort normal and breath sounds normal.  Abdominal: Soft. Bowel sounds are normal. She exhibits no distension. There is no tenderness.  Musculoskeletal: She exhibits no edema.  Neurological: She is alert and  oriented to person, place, and time.  Fluent speech  Skin: Skin is warm and dry.  Psychiatric: She has a normal mood and affect. Judgment normal.  Nursing note and vitals reviewed.    ED Treatments / Results  Labs (all labs ordered are listed, but only abnormal results are displayed) Labs Reviewed  COMPREHENSIVE METABOLIC PANEL - Abnormal; Notable for the following components:      Result Value   Potassium 3.1 (*)    Glucose, Bld 111 (*)    Creatinine, Ser 1.26 (*)    GFR calc non Af Amer 53 (*)    All other components within normal limits  CBC WITH DIFFERENTIAL/PLATELET - Abnormal; Notable for the following components:   Platelets 425 (*)    All other components within normal limits  D-DIMER, QUANTITATIVE (NOT AT [facility]) - Abnormal; Notable for the following components:   D-Dimer, Quant 0.53 (*)    All other components within normal limits  RAPID URINE DRUG SCREEN, HOSP PERFORMED  PREGNANCY, URINE  TSH    EKG EKG Interpretation  Date/Time:  Thursday January 07 2018 18:17:34 EST Ventricular Rate:  123 PR Interval:  140 QRS Duration: 92 QT Interval:  332 QTC Calculation: 475 R Axis:   99 Text Interpretation:  Sinus tachycardia Rightward axis Borderline ECG tachycardia new from previous Confirmed by Theotis Burrow [phone]) on 01/07/2018 7:19:00 PM   Radiology Ct Angio Chest Pe W/cm &/or Wo Cm  Result Date: 01/07/2018 CLINICAL DATA:  Tachycardia. Positive D-dimer. Pulmonary embolism suspected. EXAM: CT ANGIOGRAPHY CHEST WITH CONTRAST TECHNIQUE: Multidetector CT imaging of the chest was performed using the standard protocol during bolus administration of intravenous contrast. Multiplanar CT image reconstructions and MIPs were obtained to evaluate the vascular anatomy. CONTRAST:  154mL ISOVUE-370 IOPAMIDOL (ISOVUE-370) INJECTION 76% COMPARISON:  None. FINDINGS: Cardiovascular: Satisfactory opacification of pulmonary arteries noted, and no pulmonary emboli identified. No  evidence of thoracic aortic dissection or aneurysm. Mediastinum/Nodes: No masses or pathologically enlarged lymph nodes identified. Lungs/Pleura: No pulmonary mass, infiltrate, or effusion. Upper abdomen: No acute findings. Musculoskeletal: No suspicious bone lesions identified. Review of the MIP images confirms the above findings. IMPRESSION: Negative. No evidence of pulmonary embolism or other significant abnormality. Electronically Signed   By: Earle Gell M.D.   On: 01/07/2018 22:09    Procedures Procedures (including critical care time)  Medications Ordered in ED Medications  sodium chloride 0.9 % bolus 1,000 mL (0 mLs Intravenous Stopped 01/07/18 2101)  potassium chloride SA (K-DUR,KLOR-CON) CR tablet 40 mEq (40 mEq Oral Given 01/07/18 2008)  iopamidol (ISOVUE-370) 76 % injection 100 mL (100 mLs Intravenous Contrast Given 01/07/18 2144)     Initial Impression / Assessment and Plan / ED Course  I have reviewed the triage vital signs and the nursing notes.  Pertinent labs & imaging results that were available during my care  of the patient were reviewed by me and considered in my medical decision making (see chart for details).    Pt comfortable on exam and denying complaints. EKG shows sinus tachycardia. Labs show Cr 1.26 for which I gave her IVF bolus. CBC normal. Sent d-dimer as DDx includes PE; D-dimer elevated therefore obtained CTA chest. CTA negative for PE.  TSH normal.  I discussed her case with cardiologist on-call who stated that he does not commonly use labetalol but is not familiar with a reflex tachycardia with abrupt switch off of the medication.  He recommended starting a low-dose of metoprolol.  I discussed with plan with the patient and also counseled on aggressive hydration at home.  She will follow-up with her PCP.  I have extensively reviewed return precautions with her and she has voiced understanding.  Final Clinical Impressions(s) / ED Diagnoses   Final diagnoses:    Sinus tachycardia    ED Discharge Orders         Ordered    metoprolol succinate (TOPROL XL) 25 MG 24 hr tablet  Daily     01/07/18 2304           Little, Wenda Overland, MD 01/07/18 2307

## 2018-01-07 NOTE — ED Triage Notes (Signed)
States since her BP medication was changed 2 days ago she has had tachycardia.

## 2018-01-07 NOTE — ED Notes (Signed)
ED Provider at bedside. 

## 2018-01-26 ENCOUNTER — Emergency Department (HOSPITAL_BASED_OUTPATIENT_CLINIC_OR_DEPARTMENT_OTHER)
Admission: EM | Admit: 2018-01-26 | Discharge: 2018-01-26 | Disposition: A | Discharge status: Home / Self Care | Payer: BLUE CROSS/BLUE SHIELD | Attending: Emergency Medicine | Admitting: Emergency Medicine

## 2018-01-26 ENCOUNTER — Encounter (HOSPITAL_BASED_OUTPATIENT_CLINIC_OR_DEPARTMENT_OTHER): Payer: Self-pay | Admitting: *Deleted

## 2018-01-26 ENCOUNTER — Other Ambulatory Visit: Payer: Self-pay

## 2018-01-26 DIAGNOSIS — I10 Essential (primary) hypertension: Secondary | ICD-10-CM | POA: Insufficient documentation

## 2018-01-26 DIAGNOSIS — Z79899 Other long term (current) drug therapy: Secondary | ICD-10-CM | POA: Diagnosis not present

## 2018-01-26 NOTE — Discharge Instructions (Addendum)
Continue your losartan and Lopressor as prescribed yesterday.  Keep a record of your blood pressures and take these with you to your next doctor's appointment.  Ideally, this should take place next week.

## 2018-01-26 NOTE — ED Provider Notes (Signed)
[facility] EMERGENCY DEPARTMENT Provider Note   CSN: [id number] Arrival date & time: 01/26/18  1228     History   Chief Complaint Chief Complaint  Patient presents with  . Hypertension    HPI Valerie Whitehead is a 42 y.o. female.  Patient is a 42 year old female with history of hypertension.  She had previously been on labetalol, however this became ineffective.  Yesterday she was started on losartan and metoprolol.  Today at work, she checked her blood pressure and it read 170/139 presents for evaluation of this.  She denies to me that she is experiencing any complaints.  She denies to me she is having any chest pain, headache, difficulty breathing, or other.  She took an additional dose of her labetalol prior to coming here.  The history is provided by the patient.  Hypertension  This is a new problem. The problem occurs constantly. The problem has been gradually improving. Pertinent negatives include no chest pain and no headaches. Nothing aggravates the symptoms. Nothing relieves the symptoms.    Past Medical History:  Diagnosis Date  . Depression    Post partum.    Marland Kitchen GERD (gastroesophageal reflux disease)   . Hypertension     There are no active problems to display for this patient.   Past Surgical History:  Procedure Laterality Date  . CHOLECYSTECTOMY       OB History   No obstetric history on file.      Home Medications    Prior to Admission medications   Medication Sig Start Date End Date Taking? Authorizing Provider  AMLODIPINE BENZOATE PO Take by mouth.    [provider]  buPROPion (WELLBUTRIN XL) 300 MG 24 hr tablet Take 300 mg by mouth daily.      [provider]  ciprofloxacin (CIPRO) 500 MG tablet Take 1 tablet (500 mg total) by mouth 2 (two) times daily. 01/31/16   Davonna Belling, MD  cyclobenzaprine (FLEXERIL) 10 MG tablet Take 1 tablet (10 mg total) by mouth at bedtime as needed for muscle spasms. 06/24/17    Robinson, [place] N, PA-C  hydrochlorothiazide (HYDRODIURIL) 12.5 MG tablet Take 12.5 mg by mouth daily.    [provider]  labetalol (NORMODYNE) 300 MG tablet Take 300 mg by mouth 2 (two) times daily. 06/28/15   [provider]  losartan (COZAAR) 25 MG tablet Take 25 mg by mouth daily.    [provider]  metoprolol succinate (TOPROL XL) 25 MG 24 hr tablet Take 0.5 tablets (12.5 mg total) by mouth daily. 01/07/18   Little, Wenda Overland, MD  omeprazole (PRILOSEC) 20 MG capsule Take 20 mg by mouth daily.    [provider]    Family History Family History  Problem Relation Age of Onset  . Dementia Mother   . Peripheral vascular disease Mother   . Stroke Father   . Hypertension Father     Social History Social History   Tobacco Use  . Smoking status: Never Smoker  . Smokeless tobacco: Never Used  Substance Use Topics  . Alcohol use: No  . Drug use: No     Allergies   Patient has no known allergies.   Review of Systems Review of Systems  Cardiovascular: Negative for chest pain.  Neurological: Negative for headaches.  All other systems reviewed and are negative.    Physical Exam Updated Vital Signs BP (!) 149/90   Pulse 89   Temp 98.5 F (36.9 C) (Oral)   Resp Marland Kitchen)  25   Ht 5\' 8"  (1.727 m)   Wt 123.8 kg   LMP 01/19/2018   SpO2 98%   BMI 41.50 kg/m   Physical Exam Vitals signs and nursing note reviewed.  Constitutional:      General: She is not in acute distress.    Appearance: She is well-developed. She is not diaphoretic.  HENT:     Head: Normocephalic and atraumatic.  Eyes:     Extraocular Movements: Extraocular movements intact.     Pupils: Pupils are equal, round, and reactive to light.  Neck:     Musculoskeletal: Normal range of motion and neck supple.  Cardiovascular:     Rate and Rhythm: Normal rate and regular rhythm.     Heart sounds: No murmur. No friction rub. No gallop.   Pulmonary:     Effort: Pulmonary  effort is normal. No respiratory distress.     Breath sounds: Normal breath sounds. No wheezing.  Abdominal:     General: Bowel sounds are normal. There is no distension.     Palpations: Abdomen is soft.     Tenderness: There is no abdominal tenderness.  Musculoskeletal: Normal range of motion.  Skin:    General: Skin is warm and dry.  Neurological:     General: No focal deficit present.     Mental Status: She is alert. She is disoriented.     Cranial Nerves: No cranial nerve deficit.     Sensory: No sensory deficit.     Motor: No weakness.     Coordination: Coordination normal.      ED Treatments / Results  Labs (all labs ordered are listed, but only abnormal results are displayed) Labs Reviewed - No data to display  EKG EKG Interpretation  Date/Time:  Tuesday January 26 2018 12:59:18 EST Ventricular Rate:  80 PR Interval:    QRS Duration: 110 QT Interval:  403 QTC Calculation: 465 R Axis:   73 Text Interpretation:  Sinus rhythm Normal ECG Confirmed by Veryl Speak [phone]) on 01/26/2018 1:19:23 PM   Radiology No results found.  Procedures Procedures (including critical care time)  Medications Ordered in ED Medications - No data to display   Initial Impression / Assessment and Plan / ED Course  I have reviewed the triage vital signs and the nursing notes.  Pertinent labs & imaging results that were available during my care of the patient were reviewed by me and considered in my medical decision making (see chart for details).  Patient presents with elevated blood pressure.  She took a dose of her labetalol prior to coming here and her blood pressure is now 138/75.  She has no complaints and I see no indication to perform any additional testing.  I will advise her to keep a record of her blood pressure and follow-up with her primary doctor next week.  Her medications were just changed yesterday from labetalol to Lopressor and losartan.  I have advised her to give  these new medications time to take effect and see her doctor next week.  Final Clinical Impressions(s) / ED Diagnoses   Final diagnoses:  None    ED Discharge Orders    None       Veryl Speak, MD 01/26/18 1347

## 2018-01-26 NOTE — ED Notes (Signed)
PT states understanding of care given, follow up care. PT ambulated from ED to car with a steady gait.  

## 2018-01-26 NOTE — ED Notes (Signed)
Pt on monitor 

## 2018-01-26 NOTE — ED Triage Notes (Signed)
States she checked her BP this and it was elevated. Chest pain for an hour.

## 2018-10-08 ENCOUNTER — Emergency Department (HOSPITAL_BASED_OUTPATIENT_CLINIC_OR_DEPARTMENT_OTHER)
Admission: EM | Admit: 2018-10-08 | Discharge: 2018-10-08 | Disposition: A | Discharge status: Home / Self Care | Payer: Self-pay | Attending: Emergency Medicine | Admitting: Emergency Medicine

## 2018-10-08 ENCOUNTER — Encounter (HOSPITAL_BASED_OUTPATIENT_CLINIC_OR_DEPARTMENT_OTHER): Payer: Self-pay | Admitting: *Deleted

## 2018-10-08 ENCOUNTER — Other Ambulatory Visit: Payer: Self-pay

## 2018-10-08 ENCOUNTER — Emergency Department (HOSPITAL_BASED_OUTPATIENT_CLINIC_OR_DEPARTMENT_OTHER): Payer: Self-pay

## 2018-10-08 DIAGNOSIS — M5441 Lumbago with sciatica, right side: Secondary | ICD-10-CM | POA: Insufficient documentation

## 2018-10-08 DIAGNOSIS — Z79899 Other long term (current) drug therapy: Secondary | ICD-10-CM | POA: Insufficient documentation

## 2018-10-08 MED ORDER — METHYLPREDNISOLONE 4 MG PO TBPK: ORAL_TABLET | ORAL | 0 refills | Status: DC

## 2018-10-08 MED ORDER — KETOROLAC TROMETHAMINE 60 MG/2ML IM SOLN
60.0000 mg | Freq: Once | INTRAMUSCULAR | Status: AC
  Administered 2018-10-08: 60 mg via INTRAMUSCULAR
  Filled 2018-10-08: qty 2

## 2018-10-08 MED ORDER — IBUPROFEN 800 MG PO TABS: 800.0000 mg | ORAL_TABLET | Freq: Three times a day (TID) | ORAL | 0 refills | Status: DC | PRN

## 2018-10-08 MED ORDER — FAMOTIDINE 20 MG PO TABS: 20.0000 mg | ORAL_TABLET | Freq: Two times a day (BID) | ORAL | 0 refills | Status: DC

## 2018-10-08 MED ORDER — CYCLOBENZAPRINE HCL 10 MG PO TABS: 10.0000 mg | ORAL_TABLET | Freq: Two times a day (BID) | ORAL | 0 refills | Status: DC | PRN

## 2018-10-08 NOTE — ED Triage Notes (Signed)
Lower back, buttock, and bilateral leg pain x 1 week, denies inj

## 2018-10-08 NOTE — ED Notes (Signed)
Patient transported to X-ray 

## 2018-10-08 NOTE — ED Provider Notes (Signed)
[facility] EMERGENCY DEPARTMENT Provider Note   CSN: [id number] Arrival date & time: 10/08/18  0809     History   Chief Complaint Chief Complaint  Patient presents with  . Back Pain    HPI Valerie Whitehead is a 43 y.o. female.     The history is provided by the patient and medical records. No language interpreter was used.  Back Pain  Valerie Whitehead is a 43 y.o. female who presents to the Emergency Department complaining of back pain. She presents to the emergency department complaining of low back pain for the last week. Pain is located in the lower lumbar region and is across her entire low back. It radiates to her buttocks bilaterally. At times the pain goes down her right leg. She does have paresthesias to the right calf. She denies any fevers, nausea, vomiting, bowel pain, dysuria, change and urination. She does have a history of back problems but this is more intense than prior back problems. 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 Pain is worse with walking and activity, resolves when laying flat. She took ibuprofen for her pain, which initially provided some benefit but now she is having no improvement in symptoms. One month ago she was treated with antibiotics for urinary tract infection, she feels like this is resolved. Past Medical History:  Diagnosis Date  . Depression    Post partum.    Marland Kitchen GERD (gastroesophageal reflux disease)   . Hypertension     There are no active problems to display for this patient.   Past Surgical History:  Procedure Laterality Date  . CHOLECYSTECTOMY       OB History   No obstetric history on file.      Home Medications    Prior to Admission medications   Medication Sig Start Date End Date Taking? Authorizing Provider  lisinopril-hydrochlorothiazide (ZESTORETIC) 10-12.5 MG tablet Take 1 tablet by mouth daily.   Yes [provider]  AMLODIPINE BENZOATE PO Take by mouth.     [provider]  buPROPion (WELLBUTRIN XL) 300 MG 24 hr tablet Take 300 mg by mouth daily.      [provider]  ciprofloxacin (CIPRO) 500 MG tablet Take 1 tablet (500 mg total) by mouth 2 (two) times daily. 01/31/16   Davonna Belling, MD  cyclobenzaprine (FLEXERIL) 10 MG tablet Take 1 tablet (10 mg total) by mouth 2 (two) times daily as needed for muscle spasms. 10/08/18   Quintella Reichert, MD  famotidine (PEPCID) 20 MG tablet Take 1 tablet (20 mg total) by mouth 2 (two) times daily. 10/08/18   Quintella Reichert, MD  hydrochlorothiazide (HYDRODIURIL) 12.5 MG tablet Take 12.5 mg by mouth daily.    [provider]  ibuprofen (ADVIL) 800 MG tablet Take 1 tablet (800 mg total) by mouth every 8 (eight) hours as needed. 10/08/18   Quintella Reichert, MD  labetalol (NORMODYNE) 300 MG tablet Take 300 mg by mouth 2 (two) times daily. 06/28/15   [provider]  losartan (COZAAR) 25 MG tablet Take 25 mg by mouth daily.    [provider]  methylPREDNISolone (MEDROL DOSEPAK) 4 MG TBPK tablet Take according to label instructions 10/08/18   Quintella Reichert, MD  metoprolol succinate (TOPROL XL) 25 MG 24 hr tablet Take 0.5 tablets (12.5 mg total) by mouth daily. 01/07/18   Little, Wenda Overland, MD  omeprazole (PRILOSEC) 20 MG capsule Take 20 mg by mouth daily.  [provider]    Family History Family History  Problem Relation Age of Onset  . Dementia Mother   . Peripheral vascular disease Mother   . Stroke Father   . Hypertension Father     Social History Social History   Tobacco Use  . Smoking status: Never Smoker  . Smokeless tobacco: Never Used  Substance Use Topics  . Alcohol use: No  . Drug use: No     Allergies   Patient has no known allergies.   Review of Systems Review of Systems  Musculoskeletal: Positive for back pain.  All other systems reviewed and are negative.    Physical Exam Updated Vital Signs BP (!) 144/91 (BP  Location: Right Arm)   Pulse 92   Temp 98.3 F (36.8 C) (Oral)   Resp 16   Ht 5\' 9"  (1.753 m)   Wt 123.8 kg   LMP 09/29/2018 (Exact Date)   SpO2 99%   BMI 40.32 kg/m   Physical Exam Vitals signs and nursing note reviewed.  Constitutional:      Appearance: She is well-developed.  HENT:     Head: Normocephalic and atraumatic.  Cardiovascular:     Rate and Rhythm: Normal rate and regular rhythm.  Pulmonary:     Effort: Pulmonary effort is normal. No respiratory distress.  Abdominal:     Palpations: Abdomen is soft.     Tenderness: There is no abdominal tenderness. There is no guarding or rebound.  Musculoskeletal:        General: No tenderness.     Comments: 2+ DP pulses bilaterally. There is no cervical, thoracic, lumbar tenderness to palpation. No CVA tenderness. Back pain is reproducible with sitting up. Pain does reproduce with straight leg raise on the right.  Skin:    General: Skin is warm and dry.  Neurological:     Mental Status: She is alert and oriented to person, place, and time.     Comments: Five out of five strength in all four extremities with sensation to light touch intact in all four extremities.   Psychiatric:        Mood and Affect: Mood normal.        Behavior: Behavior normal.      ED Treatments / Results  Labs (all labs ordered are listed, but only abnormal results are displayed) Labs Reviewed - No data to display  EKG None  Radiology Dg Lumbar Spine Complete  Result Date: 10/08/2018 CLINICAL DATA:  Low back pain radiating down both legs for 1 week EXAM: LUMBAR SPINE - COMPLETE 4+ VIEW COMPARISON:  None. FINDINGS: There is no evidence of lumbar spine fracture. Alignment is normal. 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 IMPRESSION: No acute osseous injury of the lumbar spine. Electronically Signed   By: Kathreen Devoid   On: 10/08/2018 09:04    Procedures Procedures (including critical care time)  Medications Ordered in ED  Medications  ketorolac (TORADOL) injection 60 mg (60 mg Intramuscular Given 10/08/18 0843)     Initial Impression / Assessment and Plan / ED Course  I have reviewed the triage vital signs and the nursing notes.  Pertinent labs & imaging results that were available during my care of the patient were reviewed by me and considered in my medical decision making (see chart for details).        Here for evaluation of one week of low back pain. She is non-toxic appearing on evaluation, neurovascular the intact. Presentation is  not consistent with epidural abscess, cauda equina. Discussed with patient home care for acute low back pain with sciatica. Discussed outpatient follow-up as well as close return precautions.  Final Clinical Impressions(s) / ED Diagnoses   Final diagnoses:  Acute midline low back pain with right-sided sciatica    ED Discharge Orders         Ordered    ibuprofen (ADVIL) 800 MG tablet  Every 8 hours PRN     10/08/18 0915    cyclobenzaprine (FLEXERIL) 10 MG tablet  2 times daily PRN     10/08/18 0915    methylPREDNISolone (MEDROL DOSEPAK) 4 MG TBPK tablet     10/08/18 0915    famotidine (PEPCID) 20 MG tablet  2 times daily     10/08/18 0915           Quintella Reichert, MD 10/08/18 [phone]

## 2019-06-02 ENCOUNTER — Encounter (HOSPITAL_BASED_OUTPATIENT_CLINIC_OR_DEPARTMENT_OTHER): Payer: Self-pay | Admitting: Emergency Medicine

## 2019-06-02 ENCOUNTER — Emergency Department (HOSPITAL_BASED_OUTPATIENT_CLINIC_OR_DEPARTMENT_OTHER): Payer: PRIVATE HEALTH INSURANCE

## 2019-06-02 ENCOUNTER — Emergency Department (HOSPITAL_BASED_OUTPATIENT_CLINIC_OR_DEPARTMENT_OTHER)
Admission: EM | Admit: 2019-06-02 | Discharge: 2019-06-02 | Disposition: A | Discharge status: Home / Self Care | Payer: PRIVATE HEALTH INSURANCE | Attending: Emergency Medicine | Admitting: Emergency Medicine

## 2019-06-02 ENCOUNTER — Other Ambulatory Visit: Payer: Self-pay

## 2019-06-02 DIAGNOSIS — I10 Essential (primary) hypertension: Secondary | ICD-10-CM | POA: Diagnosis not present

## 2019-06-02 DIAGNOSIS — Z79899 Other long term (current) drug therapy: Secondary | ICD-10-CM | POA: Diagnosis not present

## 2019-06-02 DIAGNOSIS — R110 Nausea: Secondary | ICD-10-CM | POA: Insufficient documentation

## 2019-06-02 DIAGNOSIS — R1013 Epigastric pain: Secondary | ICD-10-CM

## 2019-06-02 DIAGNOSIS — K921 Melena: Secondary | ICD-10-CM | POA: Diagnosis not present

## 2019-06-02 HISTORY — DX: Anemia, unspecified: D64.9

## 2019-06-02 HISTORY — DX: Irritable bowel syndrome, unspecified: K58.9

## 2019-06-02 LAB — COMPREHENSIVE METABOLIC PANEL
ALT: 22 U/L (ref 0–44)
AST: 18 U/L (ref 15–41)
Albumin: 3.8 g/dL (ref 3.5–5.0)
Alkaline Phosphatase: 72 U/L (ref 38–126)
Anion gap: 9 (ref 5–15)
BUN: 14 mg/dL (ref 6–20)
CO2: 24 mmol/L (ref 22–32)
Calcium: 9.5 mg/dL (ref 8.9–10.3)
Chloride: 106 mmol/L (ref 98–111)
Creatinine, Ser: 0.86 mg/dL (ref 0.44–1.00)
GFR calc Af Amer: >60 mL/min (ref >60)
GFR calc non Af Amer: >60 mL/min (ref >60)
Glucose, Bld: 119 mg/dL — ABNORMAL HIGH (ref 70–99)
Potassium: 3.6 mmol/L (ref 3.5–5.1)
Sodium: 139 mmol/L (ref 135–145)
Total Bilirubin: 0.1 mg/dL — ABNORMAL LOW (ref 0.3–1.2)
Total Protein: 7.2 g/dL (ref 6.5–8.1)

## 2019-06-02 LAB — CBC WITH DIFFERENTIAL/PLATELET
Abs Immature Granulocytes: 0.01 10*3/uL (ref 0.00–0.07)
Basophils Absolute: 0 10*3/uL (ref 0.0–0.1)
Basophils Relative: 1 %
Eosinophils Absolute: 0.1 10*3/uL (ref 0.0–0.5)
Eosinophils Relative: 2 %
HCT: 40 % (ref 36.0–46.0)
Hemoglobin: 12.7 g/dL (ref 12.0–15.0)
Immature Granulocytes: 0 %
Lymphocytes Relative: 23 %
Lymphs Abs: 1.4 10*3/uL (ref 0.7–4.0)
MCH: 28.1 pg (ref 26.0–34.0)
MCHC: 31.8 g/dL (ref 30.0–36.0)
MCV: 88.5 fL (ref 80.0–100.0)
Monocytes Absolute: 0.4 10*3/uL (ref 0.1–1.0)
Monocytes Relative: 6 %
Neutro Abs: 4.2 10*3/uL (ref 1.7–7.7)
Neutrophils Relative %: 68 %
Platelets: 382 10*3/uL (ref 150–400)
RBC: 4.52 MIL/uL (ref 3.87–5.11)
RDW: 13.7 % (ref 11.5–15.5)
WBC: 6.2 10*3/uL (ref 4.0–10.5)
nRBC: 0 % (ref 0.0–0.2)

## 2019-06-02 LAB — URINALYSIS, MICROSCOPIC (REFLEX)

## 2019-06-02 LAB — URINALYSIS, ROUTINE W REFLEX MICROSCOPIC
Bilirubin Urine: NEGATIVE
Glucose, UA: NEGATIVE mg/dL
Hgb urine dipstick: NEGATIVE
Ketones, ur: NEGATIVE mg/dL
Nitrite: NEGATIVE
Protein, ur: NEGATIVE mg/dL
Specific Gravity, Urine: 1.025 (ref 1.005–1.030)
pH: 5.5 (ref 5.0–8.0)

## 2019-06-02 LAB — PREGNANCY, URINE: Preg Test, Ur: NEGATIVE

## 2019-06-02 LAB — LIPASE, BLOOD: Lipase: 40 U/L (ref 11–51)

## 2019-06-02 MED ORDER — IOHEXOL 300 MG/ML  SOLN
100.0000 mL | Freq: Once | INTRAMUSCULAR | Status: AC | PRN
  Administered 2019-06-02: 100 mL via INTRAVENOUS

## 2019-06-02 MED ORDER — SODIUM CHLORIDE 0.9 % IV BOLUS
1000.0000 mL | Freq: Once | INTRAVENOUS | Status: AC
  Administered 2019-06-02: 1000 mL via INTRAVENOUS

## 2019-06-02 MED ORDER — LIDOCAINE VISCOUS HCL 2 % MT SOLN
15.0000 mL | Freq: Once | OROMUCOSAL | Status: AC
  Administered 2019-06-02: 15 mL via ORAL
  Filled 2019-06-02: qty 15

## 2019-06-02 MED ORDER — ALUM & MAG HYDROXIDE-SIMETH 200-200-20 MG/5ML PO SUSP
30.0000 mL | Freq: Once | ORAL | Status: AC
  Administered 2019-06-02: 30 mL via ORAL
  Filled 2019-06-02: qty 30

## 2019-06-02 MED ORDER — SUCRALFATE 1 G PO TABS: 1.0000 g | ORAL_TABLET | Freq: Three times a day (TID) | ORAL | 0 refills | Status: DC

## 2019-06-02 NOTE — ED Provider Notes (Signed)
[facility] Provider Note   CSN: [id number] Arrival date & time: 06/02/19  0831     History Chief Complaint  Patient presents with  . Abdominal Pain    Valerie Whitehead is a 44 y.o. female.  Patient is a 44 year old female with history of cholecystectomy, GERD, and irritable bowel.  She presents today for evaluation of epigastric pain and burning.  This has been worsening over the past week.  She saw her primary doctor and was started on Protonix several days ago, however this is not helping.  Today patient noticed bright red blood on the toilet paper after having a bowel movement.  She denies any rectal pain.  She denies any fevers or chills.  The history is provided by the patient.  Abdominal Pain Pain location:  Epigastric Pain quality: burning   Pain radiates to:  Does not radiate Pain severity:  Moderate Onset quality:  Gradual Duration:  1 week Timing:  Constant Progression:  Worsening Chronicity:  New Relieved by:  Nothing Worsened by:  Nothing Ineffective treatments: Protonix. Associated symptoms: hematochezia and nausea   Associated symptoms: no constipation and no fever        Past Medical History:  Diagnosis Date  . Anemia   . Depression    Post partum.    Marland Kitchen GERD (gastroesophageal reflux disease)   . Hypertension   . IBS (irritable bowel syndrome)     There are no problems to display for this patient.   Past Surgical History:  Procedure Laterality Date  . CHOLECYSTECTOMY       OB History   No obstetric history on file.     Family History  Problem Relation Age of Onset  . Dementia Mother   . Peripheral vascular disease Mother   . Stroke Father   . Hypertension Father     Social History   Tobacco Use  . Smoking status: Never Smoker  . Smokeless tobacco: Never Used  Substance Use Topics  . Alcohol use: No  . Drug use: No    Home Medications Prior to Admission medications   Medication Sig Start Date  End Date Taking? Authorizing Provider  buPROPion (WELLBUTRIN XL) 300 MG 24 hr tablet Take 300 mg by mouth daily.     Yes [provider]  iron polysaccharides (NIFEREX) 150 MG capsule Take by mouth. 09/15/17  Yes [provider]  lisinopril (ZESTRIL) 20 MG tablet Take 20 mg by mouth daily.   Yes [provider]  AMLODIPINE BENZOATE PO Take by mouth.    [provider]  ciprofloxacin (CIPRO) 500 MG tablet Take 1 tablet (500 mg total) by mouth 2 (two) times daily. 01/31/16   Davonna Belling, MD  cyclobenzaprine (FLEXERIL) 10 MG tablet Take 1 tablet (10 mg total) by mouth 2 (two) times daily as needed for muscle spasms. 10/08/18   Quintella Reichert, MD  famotidine (PEPCID) 20 MG tablet Take 1 tablet (20 mg total) by mouth 2 (two) times daily. 10/08/18   Quintella Reichert, MD  hydrochlorothiazide (HYDRODIURIL) 12.5 MG tablet Take 12.5 mg by mouth daily.    [provider]  ibuprofen (ADVIL) 800 MG tablet Take 1 tablet (800 mg total) by mouth every 8 (eight) hours as needed. 10/08/18   Quintella Reichert, MD  labetalol (NORMODYNE) 300 MG tablet Take 300 mg by mouth 2 (two) times daily. 06/28/15   [provider]  lisinopril-hydrochlorothiazide (ZESTORETIC) 10-12.5 MG tablet Take 1 tablet by mouth daily.    [provider]  losartan (COZAAR) 25 MG tablet Take 25 mg by mouth daily.    [provider]  methylPREDNISolone (MEDROL DOSEPAK) 4 MG TBPK tablet Take according to label instructions 10/08/18   Quintella Reichert, MD  metoprolol succinate (TOPROL XL) 25 MG 24 hr tablet Take 0.5 tablets (12.5 mg total) by mouth daily. 01/07/18   Little, Wenda Overland, MD  omeprazole (PRILOSEC) 20 MG capsule Take 20 mg by mouth daily.    [provider]  pantoprazole (PROTONIX) 40 MG tablet Take 40 mg by mouth daily. 05/24/19   [provider]    Allergies    Patient has no known allergies.  Review of Systems   Review of Systems    Constitutional: Negative for fever.  Gastrointestinal: Positive for abdominal pain, hematochezia and nausea. Negative for constipation.  All other systems reviewed and are negative.   Physical Exam Updated Vital Signs BP 138/82 (BP Location: Right Arm)   Pulse 84   Temp 98.2 F (36.8 C) (Oral)   Resp 18   Ht 5\' 8"  (1.727 m)   Wt 125.9 kg   LMP 05/21/2019   SpO2 100%   BMI 42.19 kg/m   Physical Exam Vitals and nursing note reviewed.  Constitutional:      General: She is not in acute distress.    Appearance: She is well-developed. She is not diaphoretic.  HENT:     Head: Normocephalic and atraumatic.  Cardiovascular:     Rate and Rhythm: Normal rate and regular rhythm.     Heart sounds: No murmur. No friction rub. No gallop.   Pulmonary:     Effort: Pulmonary effort is normal. No respiratory distress.     Breath sounds: Normal breath sounds. No wheezing.  Abdominal:     General: Bowel sounds are normal. There is no distension.     Palpations: Abdomen is soft.     Tenderness: There is abdominal tenderness in the epigastric area. There is no right CVA tenderness, left CVA tenderness, guarding or rebound.     Comments: There is mild tenderness in the epigastric region.  Musculoskeletal:        General: Normal range of motion.     Cervical back: Normal range of motion and neck supple.  Skin:    General: Skin is warm and dry.  Neurological:     Mental Status: She is alert and oriented to person, place, and time.     ED Results / Procedures / Treatments   Labs (all labs ordered are listed, but only abnormal results are displayed) Labs Reviewed - No data to display  EKG None  Radiology No results found.  Procedures Procedures (including critical care time)  Medications Ordered in ED Medications  sodium chloride 0.9 % bolus 1,000 mL (has no administration in time range)  alum & mag hydroxide-simeth (MAALOX/MYLANTA) 200-200-20 MG/5ML suspension 30 mL (has no  administration in time range)    And  lidocaine (XYLOCAINE) 2 % viscous mouth solution 15 mL (has no administration in time range)    ED Course  I have reviewed the triage vital signs and the nursing notes.  Pertinent labs & imaging results that were available during my care of the patient were reviewed by me and considered in my medical decision making (see chart for details).    MDM Rules/Calculators/A&P  Patient presenting here with complaints of epigastric burning worsening over the past week.  Patient had previously been on Prilosec and was recently changed to Protonix.  Symptoms became worse today and she presents for evaluation of it.  She noticed bright red blood after a bowel movement, but denies any rectal pain.  Patient's work-up shows unremarkable CBC and metabolic profile.  Her CT scan shows no evidence for acute intra-abdominal process.  She does have a renal cyst for which radiologist has recommended outpatient MRI.  Patient made aware of this finding and will schedule this through her primary doctor.  I will prescribe Carafate which she can take in addition to her recently prescribed Protonix.  A GI cocktail did give her some relief and I highly suspect GERD as the etiology of her symptoms.  I am uncertain about the etiology of the rectal bleeding, but she will monitor this at home and follow-up with GI as needed.  Final Clinical Impression(s) / ED Diagnoses Final diagnoses:  None    Rx / DC Orders ED Discharge Orders    None       Veryl Speak, MD 06/02/19 [phone]

## 2019-06-02 NOTE — Discharge Instructions (Addendum)
Begin taking Carafate in addition to Prilosec.  Follow-up with your primary doctor for a recheck in the next week.    You also have what appears to be a renal cyst on your CT scan, but radiology is recommending an MRI to further evaluate.  Please schedule this through your primary doctor.

## 2019-06-02 NOTE — ED Triage Notes (Signed)
Having epigastric pain and burning for the last week. Eval by PCP on 4/19 and started on Protonix. Noticed bright red blood in stool x 2, has IBS

## 2020-01-08 IMAGING — CT CT ANGIO CHEST
2 of 9 series · 19 of 36 positions shown · IV contrast (iopamidol)
Comparison: None.

CLINICAL DATA: Tachycardia. Positive D-dimer. Pulmonary embolism
suspected.

EXAM:
CT ANGIOGRAPHY CHEST WITH CONTRAST
TECHNIQUE: Multidetector CT imaging of the chest was performed using the
standard protocol during bolus administration of intravenous
contrast. Multiplanar CT image reconstructions and MIPs were
obtained to evaluate the vascular anatomy.
CONTRAST:  100mL 0927LU-8HI IOPAMIDOL (0927LU-8HI) INJECTION 76%

[Series 7: pe thins · axial · 0.63mm/px · z∈[-296,-27]mm · 18 of 301 slices shown]
[im 16/301  lung]
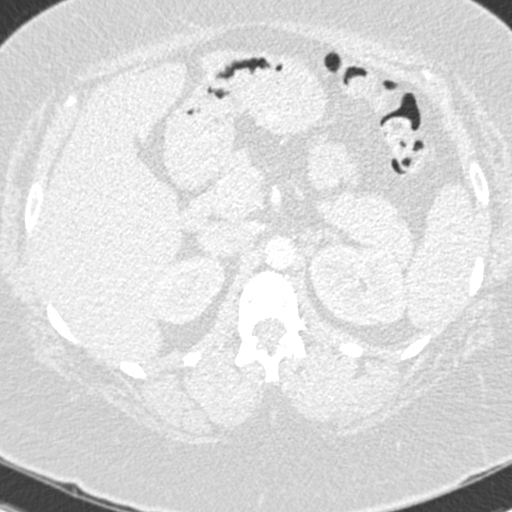
[im 32/301  mediastinal]
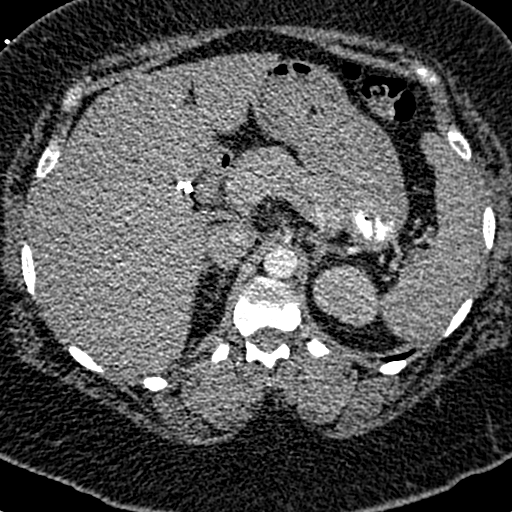
[im 48/301  lung]
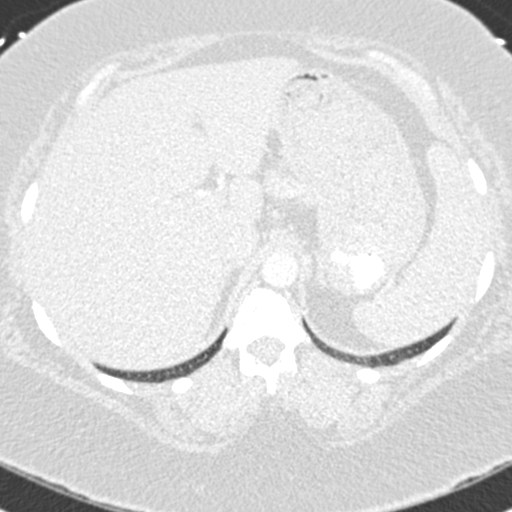
[im 64/301  mediastinal]
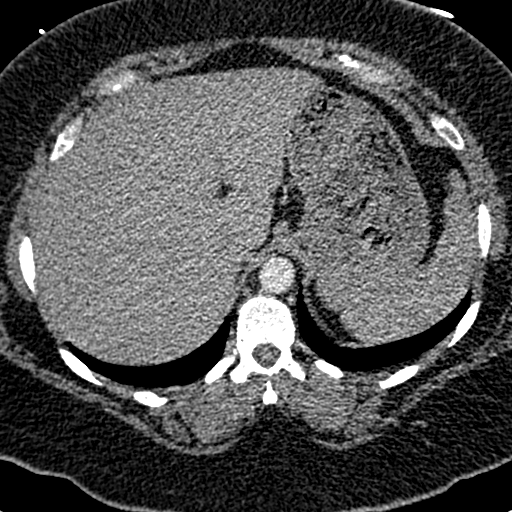
[im 79/301  lung]
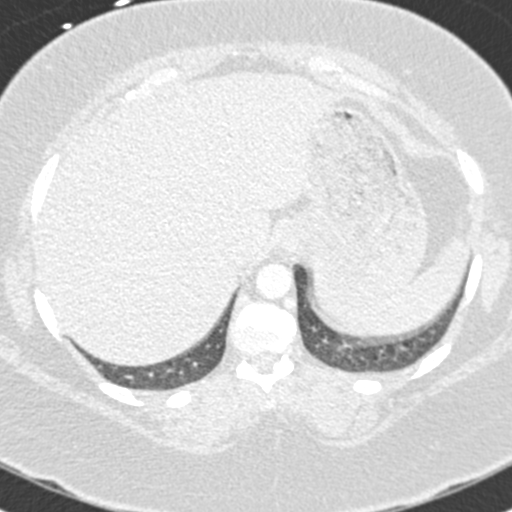
[im 95/301  mediastinal]
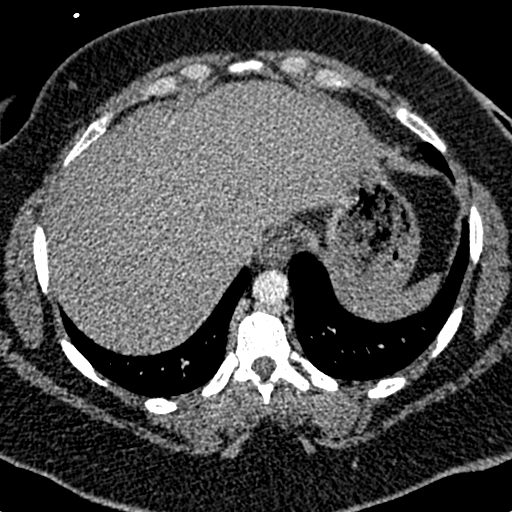
[im 111/301  lung]
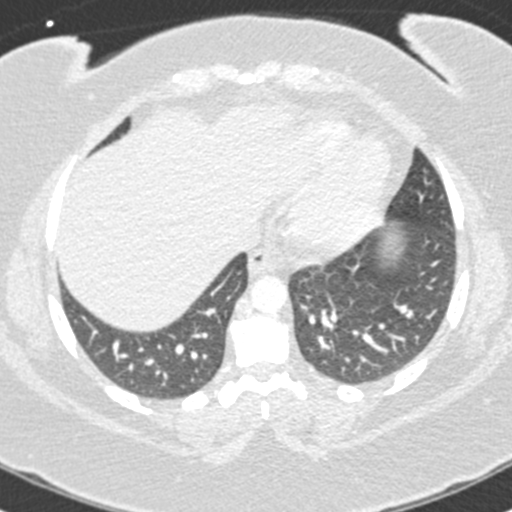
[im 127/301  mediastinal]
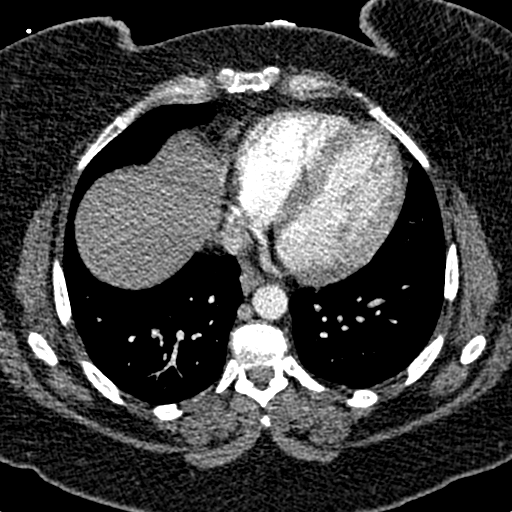
[im 143/301  lung]
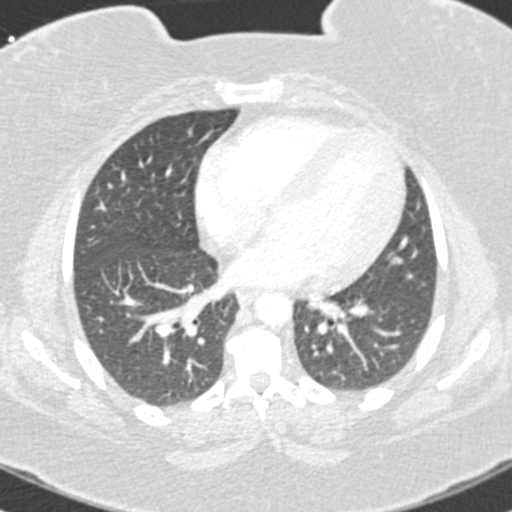
[im 158/301  mediastinal]
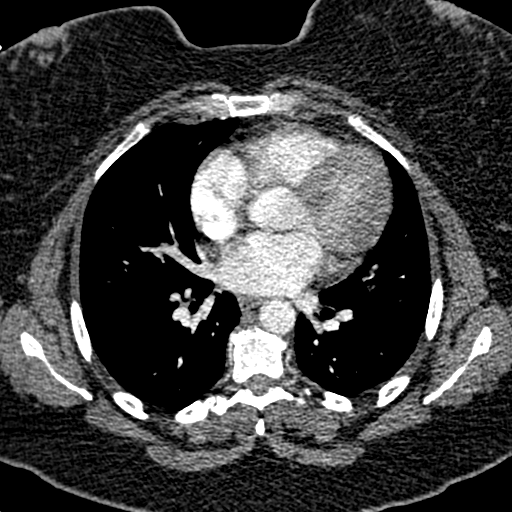
[im 174/301  lung]
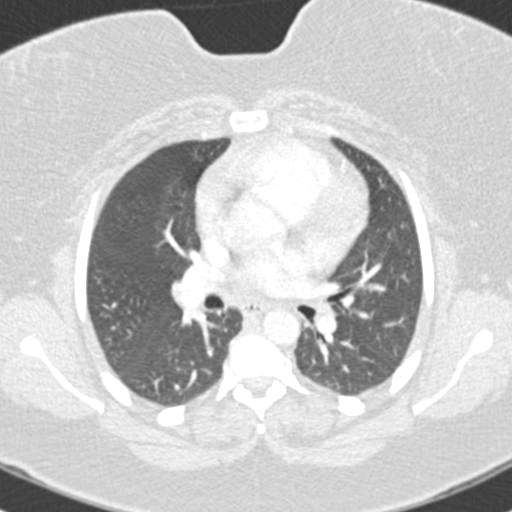
[im 190/301  mediastinal]
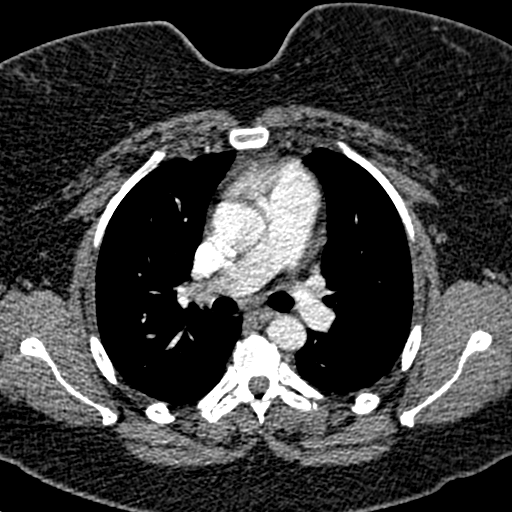
[im 206/301  lung]
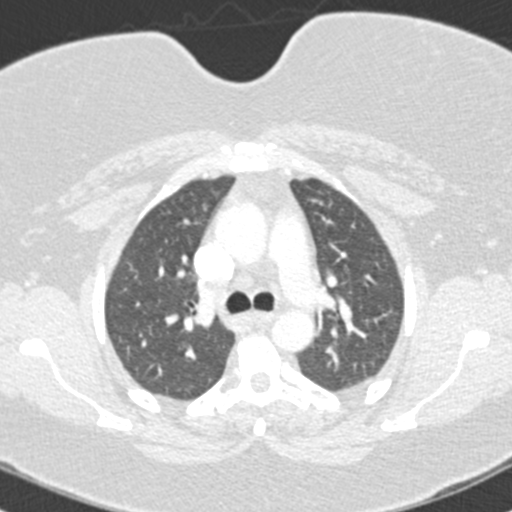
[im 222/301  mediastinal]
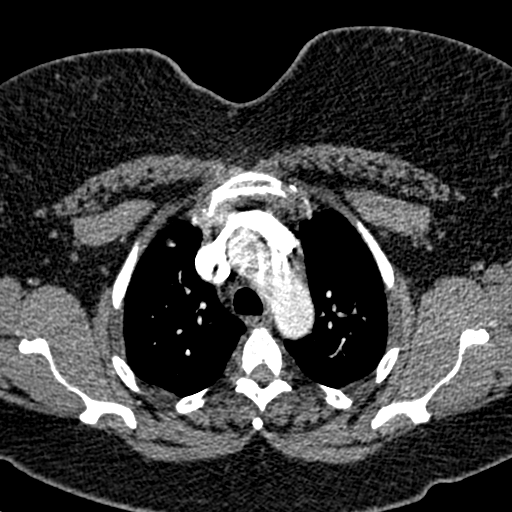
[im 237/301  lung]
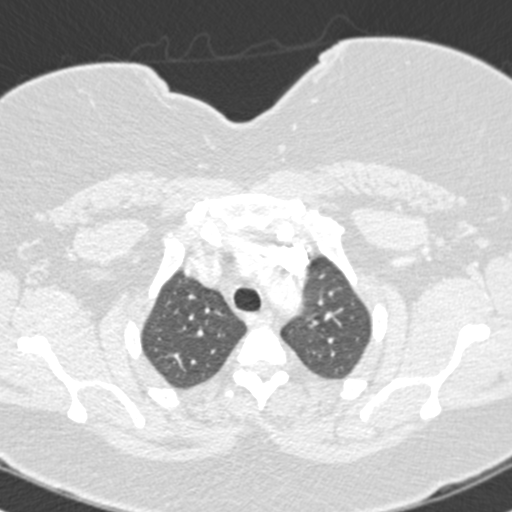
[im 253/301  mediastinal]
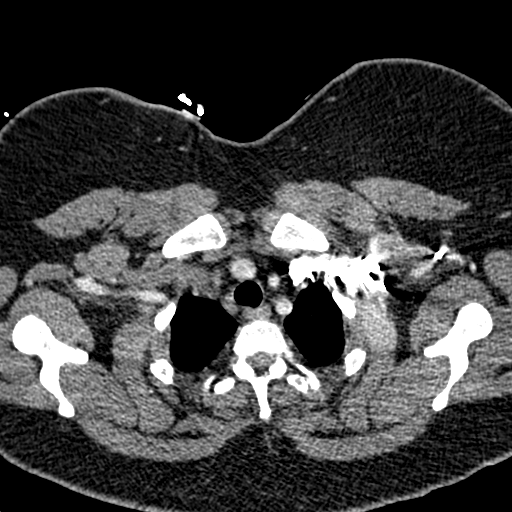
[im 269/301  lung]
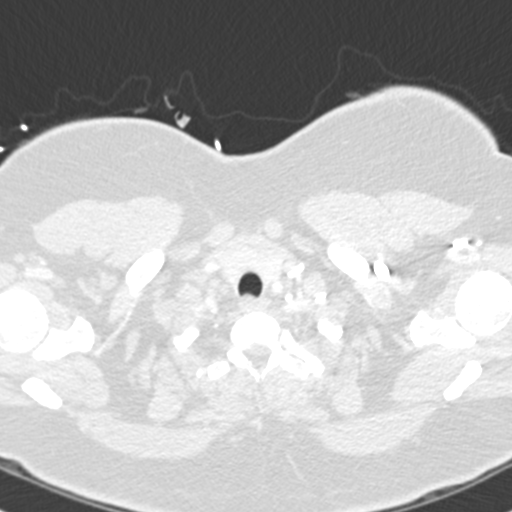
[im 285/301  mediastinal]
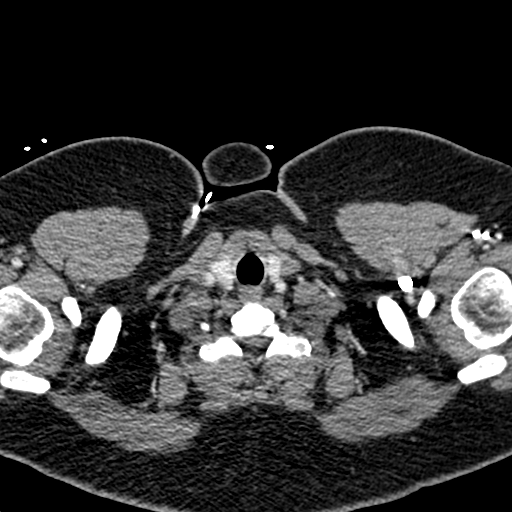

[Series 8: pe coronal mpr · coronal · 0.60mm/px · 1 of 152 slices shown]
[im 76/152  mediastinal]
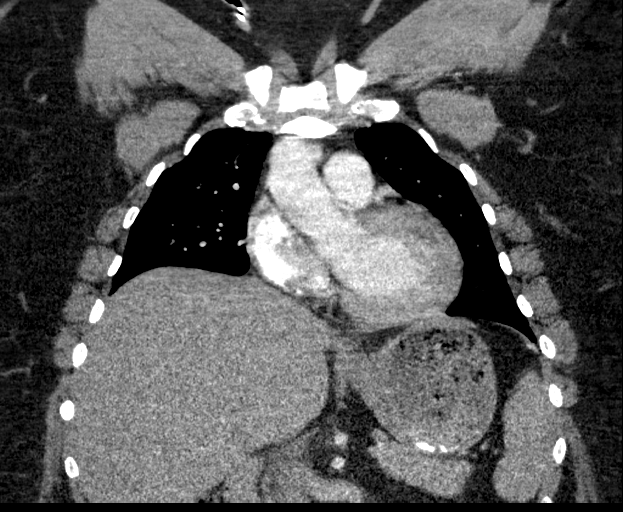

[19 of 36 positions shown; findings below may reference images not displayed]

FINDINGS: Cardiovascular: Satisfactory opacification of pulmonary arteries
noted, and no pulmonary emboli identified. No evidence of thoracic
aortic dissection or aneurysm.

Mediastinum/Nodes: No masses or pathologically enlarged lymph nodes
identified.

Lungs/Pleura: No pulmonary mass, infiltrate, or effusion.

Upper abdomen: No acute findings.

Musculoskeletal: No suspicious bone lesions identified.

Review of the MIP images confirms the above findings.
IMPRESSION: Negative. No evidence of pulmonary embolism or other significant
abnormality.

## 2020-03-05 DIAGNOSIS — Z85528 Personal history of other malignant neoplasm of kidney: Secondary | ICD-10-CM | POA: Insufficient documentation

## 2020-03-05 HISTORY — DX: Personal history of other malignant neoplasm of kidney: Z85.528

## 2020-06-09 ENCOUNTER — Other Ambulatory Visit: Payer: Self-pay

## 2020-06-09 ENCOUNTER — Encounter (HOSPITAL_BASED_OUTPATIENT_CLINIC_OR_DEPARTMENT_OTHER): Payer: Self-pay | Admitting: Emergency Medicine

## 2020-06-09 ENCOUNTER — Emergency Department (HOSPITAL_BASED_OUTPATIENT_CLINIC_OR_DEPARTMENT_OTHER)
Admission: EM | Admit: 2020-06-09 | Discharge: 2020-06-10 | Disposition: A | Discharge status: Home / Self Care | Payer: Self-pay | Attending: Emergency Medicine | Admitting: Emergency Medicine

## 2020-06-09 ENCOUNTER — Emergency Department (HOSPITAL_BASED_OUTPATIENT_CLINIC_OR_DEPARTMENT_OTHER): Payer: Self-pay

## 2020-06-09 DIAGNOSIS — Z85528 Personal history of other malignant neoplasm of kidney: Secondary | ICD-10-CM | POA: Insufficient documentation

## 2020-06-09 DIAGNOSIS — R1013 Epigastric pain: Secondary | ICD-10-CM | POA: Insufficient documentation

## 2020-06-09 DIAGNOSIS — Z79899 Other long term (current) drug therapy: Secondary | ICD-10-CM | POA: Insufficient documentation

## 2020-06-09 DIAGNOSIS — R072 Precordial pain: Secondary | ICD-10-CM | POA: Insufficient documentation

## 2020-06-09 DIAGNOSIS — K219 Gastro-esophageal reflux disease without esophagitis: Secondary | ICD-10-CM | POA: Insufficient documentation

## 2020-06-09 DIAGNOSIS — I10 Essential (primary) hypertension: Secondary | ICD-10-CM | POA: Insufficient documentation

## 2020-06-09 HISTORY — DX: Malignant (primary) neoplasm, unspecified: C80.1

## 2020-06-09 LAB — BASIC METABOLIC PANEL
Anion gap: 11 (ref 5–15)
BUN: 18 mg/dL (ref 6–20)
CO2: 22 mmol/L (ref 22–32)
Calcium: 9.1 mg/dL (ref 8.9–10.3)
Chloride: 103 mmol/L (ref 98–111)
Creatinine, Ser: 1.29 mg/dL — ABNORMAL HIGH (ref 0.44–1.00)
GFR, Estimated: 53 mL/min — ABNORMAL LOW (ref >60)
Glucose, Bld: 107 mg/dL — ABNORMAL HIGH (ref 70–99)
Potassium: 3.3 mmol/L — ABNORMAL LOW (ref 3.5–5.1)
Sodium: 136 mmol/L (ref 135–145)

## 2020-06-09 LAB — CBC
HCT: 39.3 % (ref 36.0–46.0)
Hemoglobin: 13.1 g/dL (ref 12.0–15.0)
MCH: 29.8 pg (ref 26.0–34.0)
MCHC: 33.3 g/dL (ref 30.0–36.0)
MCV: 89.5 fL (ref 80.0–100.0)
Platelets: 342 10*3/uL (ref 150–400)
RBC: 4.39 MIL/uL (ref 3.87–5.11)
RDW: 12.9 % (ref 11.5–15.5)
WBC: 8.2 10*3/uL (ref 4.0–10.5)
nRBC: 0 % (ref 0.0–0.2)

## 2020-06-09 LAB — URINALYSIS, ROUTINE W REFLEX MICROSCOPIC
Bilirubin Urine: NEGATIVE
Glucose, UA: NEGATIVE mg/dL
Hgb urine dipstick: NEGATIVE
Ketones, ur: NEGATIVE mg/dL
Leukocytes,Ua: NEGATIVE
Nitrite: NEGATIVE
Protein, ur: NEGATIVE mg/dL
Specific Gravity, Urine: 1.025 (ref 1.005–1.030)
pH: 5.5 (ref 5.0–8.0)

## 2020-06-09 LAB — PREGNANCY, URINE: Preg Test, Ur: NEGATIVE

## 2020-06-09 LAB — TROPONIN I (HIGH SENSITIVITY): Troponin I (High Sensitivity): <2 ng/L (ref <18)

## 2020-06-09 MED ORDER — ALUM & MAG HYDROXIDE-SIMETH 200-200-20 MG/5ML PO SUSP
30.0000 mL | Freq: Once | ORAL | Status: AC
  Administered 2020-06-09: 30 mL via ORAL
  Filled 2020-06-09: qty 30

## 2020-06-09 MED ORDER — LIDOCAINE VISCOUS HCL 2 % MT SOLN
15.0000 mL | Freq: Once | OROMUCOSAL | Status: AC
  Administered 2020-06-09: 15 mL via OROMUCOSAL
  Filled 2020-06-09: qty 15

## 2020-06-09 NOTE — ED Notes (Signed)
States having "real bad indigestion" chest pain, onset approx noon today. States has hx of GERD, taking meds daily for GERD. States she has an increase in chest pain, states works at a [employer] and may have pulled something.

## 2020-06-09 NOTE — ED Triage Notes (Signed)
Reports "real bad indigestion" type chest pain since this afternoon. No meds PTA. Also concerned for urinary tract infection d/t anuria.

## 2020-06-10 LAB — TROPONIN I (HIGH SENSITIVITY): Troponin I (High Sensitivity): <2 ng/L (ref <18)

## 2020-06-10 MED ORDER — SUCRALFATE 1 GM/10ML PO SUSP: 1.0000 g | Freq: Three times a day (TID) | ORAL | 0 refills | Status: DC

## 2020-06-10 NOTE — ED Provider Notes (Signed)
[facility] Provider Note   CSN: [id number] Arrival date & time: 06/09/20  2150     History Chief Complaint  Patient presents with  . Chest Pain    Valerie Whitehead is a 45 y.o. female.  The history is provided by the patient.  Chest Pain Pain location:  Substernal area and epigastric Pain quality comment:  Indigestion Pain radiates to:  Does not radiate Pain severity:  Moderate Onset quality:  Gradual Duration:  8 hours Timing:  Constant Progression:  Unchanged Chronicity:  New Context: eating   Context comment:  Pizza Relieved by:  Nothing Worsened by:  Nothing Ineffective treatments:  None tried Associated symptoms: heartburn   Associated symptoms: no abdominal pain, no AICD problem, no altered mental status, no anorexia, no anxiety, no back pain, no claudication, no cough, no diaphoresis, no dizziness, no dysphagia, no fatigue, no fever, no headache, no lower extremity edema, no nausea, no near-syncope, no numbness, no orthopnea, no palpitations, no PND, no shortness of breath, no syncope, no vomiting and no weakness   Risk factors: no coronary artery disease and not female   Patient with GERD and IBS presents with indigestion pain following eating pizza.  No SOB.  No n/v/d.  No DOE no exertional symptoms      Past Medical History:  Diagnosis Date  . Anemia   . Cancer ([place])    kidney 2021  . Depression    Post partum.    Marland Kitchen GERD (gastroesophageal reflux disease)   . Hypertension   . IBS (irritable bowel syndrome)     There are no problems to display for this patient.   Past Surgical History:  Procedure Laterality Date  . CHOLECYSTECTOMY       OB History   No obstetric history on file.     Family History  Problem Relation Age of Onset  . Dementia Mother   . Peripheral vascular disease Mother   . Stroke Father   . Hypertension Father     Social History   Tobacco Use  . Smoking status: Never Smoker  . Smokeless  tobacco: Never Used  Substance Use Topics  . Alcohol use: No  . Drug use: No    Home Medications Prior to Admission medications   Medication Sig Start Date End Date Taking? Authorizing Provider  AMLODIPINE BENZOATE PO Take by mouth.    [provider]  buPROPion (WELLBUTRIN XL) 300 MG 24 hr tablet Take 300 mg by mouth daily.      [provider]  ciprofloxacin (CIPRO) 500 MG tablet Take 1 tablet (500 mg total) by mouth 2 (two) times daily. 01/31/16   Davonna Belling, MD  cyclobenzaprine (FLEXERIL) 10 MG tablet Take 1 tablet (10 mg total) by mouth 2 (two) times daily as needed for muscle spasms. 10/08/18   Quintella Reichert, MD  famotidine (PEPCID) 20 MG tablet Take 1 tablet (20 mg total) by mouth 2 (two) times daily. 10/08/18   Quintella Reichert, MD  hydrochlorothiazide (HYDRODIURIL) 12.5 MG tablet Take 12.5 mg by mouth daily.    [provider]  ibuprofen (ADVIL) 800 MG tablet Take 1 tablet (800 mg total) by mouth every 8 (eight) hours as needed. 10/08/18   Quintella Reichert, MD  iron polysaccharides (NIFEREX) 150 MG capsule Take by mouth. 09/15/17   [provider]  labetalol (NORMODYNE) 300 MG tablet Take 300 mg by mouth 2 (two) times daily. 06/28/15   [provider]  lisinopril (ZESTRIL) 20 MG tablet  Take 20 mg by mouth daily.    [provider]  lisinopril-hydrochlorothiazide (ZESTORETIC) 10-12.5 MG tablet Take 1 tablet by mouth daily.    [provider]  losartan (COZAAR) 25 MG tablet Take 25 mg by mouth daily.    [provider]  methylPREDNISolone (MEDROL DOSEPAK) 4 MG TBPK tablet Take according to label instructions 10/08/18   Quintella Reichert, MD  metoprolol succinate (TOPROL XL) 25 MG 24 hr tablet Take 0.5 tablets (12.5 mg total) by mouth daily. 01/07/18   Little, Wenda Overland, MD  omeprazole (PRILOSEC) 20 MG capsule Take 20 mg by mouth daily.    [provider]  pantoprazole (PROTONIX) 40 MG tablet Take 40 mg  by mouth daily. 05/24/19   [provider]  sucralfate (CARAFATE) 1 g tablet Take 1 tablet (1 g total) by mouth 4 (four) times daily -  with meals and at bedtime. 06/02/19   Veryl Speak, MD    Allergies    Patient has no known allergies.  Review of Systems   Review of Systems  Constitutional: Negative for diaphoresis, fatigue and fever.  HENT: Negative for trouble swallowing.   Eyes: Negative for visual disturbance.  Respiratory: Negative for cough and shortness of breath.   Cardiovascular: Positive for chest pain. Negative for palpitations, orthopnea, claudication, syncope, PND and near-syncope.  Gastrointestinal: Positive for heartburn. Negative for abdominal pain, anorexia, nausea and vomiting.  Genitourinary: Negative for difficulty urinating.  Musculoskeletal: Negative for back pain.  Skin: Negative for rash.  Neurological: Negative for dizziness, weakness, numbness and headaches.  Psychiatric/Behavioral: Negative for agitation.  All other systems reviewed and are negative.   Physical Exam Updated Vital Signs BP 124/81   Pulse 83   Temp 98.6 F (37 C)   Resp 18   Ht 5\' 8"  (1.727 m)   Wt 127 kg   LMP 05/28/2020 (Approximate)   SpO2 98%   BMI 42.57 kg/m   Physical Exam Vitals and nursing note reviewed.  Constitutional:      Appearance: Normal appearance. She is not diaphoretic.  HENT:     Head: Normocephalic and atraumatic.     Nose: Nose normal.  Eyes:     Conjunctiva/sclera: Conjunctivae normal.     Pupils: Pupils are equal, round, and reactive to light.  Cardiovascular:     Rate and Rhythm: Normal rate and regular rhythm.     Pulses: Normal pulses.     Heart sounds: Normal heart sounds.  Pulmonary:     Effort: Pulmonary effort is normal.     Breath sounds: Normal breath sounds.  Abdominal:     General: Abdomen is flat. Bowel sounds are normal.     Palpations: Abdomen is soft.     Tenderness: There is no abdominal tenderness. There is no  guarding.  Musculoskeletal:        General: Normal range of motion.     Cervical back: Normal range of motion and neck supple.  Skin:    General: Skin is warm and dry.     Capillary Refill: Capillary refill takes less than 2 seconds.  Neurological:     General: No focal deficit present.     Mental Status: She is alert and oriented to person, place, and time.     Deep Tendon Reflexes: Reflexes normal.  Psychiatric:        Mood and Affect: Mood normal.        Behavior: Behavior normal.     ED Results / Procedures / Treatments  Labs (all labs ordered are listed, but only abnormal results are displayed) Results for orders placed or performed during the hospital encounter of 61/60/73  Basic metabolic panel  Result Value Ref Range   Sodium 136 135 - 145 mmol/L   Potassium 3.3 (L) 3.5 - 5.1 mmol/L   Chloride 103 98 - 111 mmol/L   CO2 22 22 - 32 mmol/L   Glucose, Bld 107 (H) 70 - 99 mg/dL   BUN 18 6 - 20 mg/dL   Creatinine, Ser 1.29 (H) 0.44 - 1.00 mg/dL   Calcium 9.1 8.9 - 10.3 mg/dL   GFR, Estimated 53 (L) >60 mL/min   Anion gap 11 5 - 15  CBC  Result Value Ref Range   WBC 8.2 4.0 - 10.5 K/uL   RBC 4.39 3.87 - 5.11 MIL/uL   Hemoglobin 13.1 12.0 - 15.0 g/dL   HCT 39.3 36.0 - 46.0 %   MCV 89.5 80.0 - 100.0 fL   MCH 29.8 26.0 - 34.0 pg   MCHC 33.3 30.0 - 36.0 g/dL   RDW 12.9 11.5 - 15.5 %   Platelets 342 150 - 400 K/uL   nRBC 0.0 0.0 - 0.2 %  Pregnancy, urine  Result Value Ref Range   Preg Test, Ur NEGATIVE NEGATIVE  Urinalysis, Routine w reflex microscopic  Result Value Ref Range   Color, Urine YELLOW YELLOW   APPearance CLEAR CLEAR   Specific Gravity, Urine 1.025 1.005 - 1.030   pH 5.5 5.0 - 8.0   Glucose, UA NEGATIVE NEGATIVE mg/dL   Hgb urine dipstick NEGATIVE NEGATIVE   Bilirubin Urine NEGATIVE NEGATIVE   Ketones, ur NEGATIVE NEGATIVE mg/dL   Protein, ur NEGATIVE NEGATIVE mg/dL   Nitrite NEGATIVE NEGATIVE   Leukocytes,Ua NEGATIVE NEGATIVE  Troponin I (High  Sensitivity)  Result Value Ref Range   Troponin I (High Sensitivity) <2 <18 ng/L  Troponin I (High Sensitivity)  Result Value Ref Range   Troponin I (High Sensitivity) <2 <18 ng/L   DG Chest 2 View  Result Date: 06/09/2020 CLINICAL DATA:  Chest pain. EXAM: CHEST - 2 VIEW COMPARISON:  August 18, 2019 FINDINGS: There is no evidence of acute infiltrate, pleural effusion or pneumothorax. The heart size and mediastinal contours are within normal limits. Radiopaque surgical clips are seen within the right upper quadrant. The visualized skeletal structures are unremarkable. IMPRESSION: No active cardiopulmonary disease. Electronically Signed   By: Virgina Norfolk M.D.   On: 06/09/2020 22:48    EKG  EKG Interpretation  Date/Time:  Saturday Valerie Whitehead 30 2022 21:56:40 EDT Ventricular Rate:  96 PR Interval:  164 QRS Duration: 94 QT Interval:  374 QTC Calculation: 472 R Axis:   61 Text Interpretation: Normal sinus rhythm Normal ECG Confirmed by Randal Buba, Radiance Deady ([id number]) on 06/10/2020 12:35:34 AM       Radiology DG Chest 2 View  Result Date: 06/09/2020 CLINICAL DATA:  Chest pain. EXAM: CHEST - 2 VIEW COMPARISON:  August 18, 2019 FINDINGS: There is no evidence of acute infiltrate, pleural effusion or pneumothorax. The heart size and mediastinal contours are within normal limits. Radiopaque surgical clips are seen within the right upper quadrant. The visualized skeletal structures are unremarkable. IMPRESSION: No active cardiopulmonary disease. Electronically Signed   By: Virgina Norfolk M.D.   On: 06/09/2020 22:48    Procedures Procedures   Medications Ordered in ED Medications  alum & mag hydroxide-simeth (MAALOX/MYLANTA) 200-200-20 MG/5ML suspension 30 mL (30 mLs Oral Given 06/09/20 2314)  lidocaine (XYLOCAINE) 2 % viscous  mouth solution 15 mL (15 mLs Mouth/Throat Given 06/09/20 2314)    ED Course  I have reviewed the triage vital signs and the nursing notes.  Pertinent labs & imaging results  that were available during my care of the patient were reviewed by me and considered in my medical decision making (see chart for details).    Ruled out for MI in the ED heart score is 1 very low risk for MACE.  Perc negative wells 0, highly doubt PE in this low risk patient.  I believe symptoms are consistent with GERD and have advised abstention from greasy, spicy foods.  Will add carafate.    Valerie Whitehead was evaluated in Emergency Department on 06/10/2020 for the symptoms described in the history of present illness. She was evaluated in the context of the global COVID-19 pandemic, which necessitated consideration that the patient might be at risk for infection with the SARS-CoV-2 virus that causes COVID-19. Institutional protocols and algorithms that pertain to the evaluation of patients at risk for COVID-19 are in a state of rapid change based on information released by regulatory bodies including the CDC and federal and state organizations. These policies and algorithms were followed during the patient's care in the ED.  Final Clinical Impression(s) / ED Diagnoses Return for intractable cough, coughing up blood, fevers >100.4 unrelieved by medication, shortness of breath, intractable vomiting, chest pain, shortness of breath, weakness, numbness, changes in speech, facial asymmetry, abdominal pain, passing out, Inability to tolerate liquids or food, cough, altered mental status or any concerns. No signs of systemic illness or infection. The patient is nontoxic-appearing on exam and vital signs are within normal limits.  I have reviewed the triage vital signs and the nursing notes. Pertinent labs & imaging results that were available during my care of the patient were reviewed by me and considered in my medical decision making (see chart for details). After history, exam, and medical workup I feel the patient has been appropriately medically screened and is safe for discharge home. Pertinent  diagnoses were discussed with the patient. Patient was given return precautions.   Mikal Blasdell, MD 06/10/20 [id number]

## 2021-02-12 DIAGNOSIS — E785 Hyperlipidemia, unspecified: Secondary | ICD-10-CM

## 2021-02-12 HISTORY — DX: Hyperlipidemia, unspecified: E78.5

## 2021-04-08 DIAGNOSIS — N1831 Chronic kidney disease, stage 3a: Secondary | ICD-10-CM

## 2021-04-08 HISTORY — DX: Chronic kidney disease, stage 3a: N18.31

## 2021-04-13 ENCOUNTER — Emergency Department (HOSPITAL_BASED_OUTPATIENT_CLINIC_OR_DEPARTMENT_OTHER)
Admission: EM | Admit: 2021-04-13 | Discharge: 2021-04-14 | Disposition: A | Discharge status: Home / Self Care | Payer: PRIVATE HEALTH INSURANCE | Attending: Emergency Medicine | Admitting: Emergency Medicine

## 2021-04-13 ENCOUNTER — Encounter (HOSPITAL_BASED_OUTPATIENT_CLINIC_OR_DEPARTMENT_OTHER): Payer: Self-pay | Admitting: Emergency Medicine

## 2021-04-13 ENCOUNTER — Other Ambulatory Visit: Payer: Self-pay

## 2021-04-13 DIAGNOSIS — N3001 Acute cystitis with hematuria: Secondary | ICD-10-CM | POA: Diagnosis not present

## 2021-04-13 DIAGNOSIS — R300 Dysuria: Secondary | ICD-10-CM | POA: Diagnosis present

## 2021-04-13 LAB — URINALYSIS, MICROSCOPIC (REFLEX): RBC / HPF: >50 RBC/hpf (ref 0–5)

## 2021-04-13 LAB — URINALYSIS, ROUTINE W REFLEX MICROSCOPIC
Bilirubin Urine: NEGATIVE
Glucose, UA: NEGATIVE mg/dL
Ketones, ur: NEGATIVE mg/dL
Nitrite: NEGATIVE
Protein, ur: 100 mg/dL — AB
Specific Gravity, Urine: >=1.03 (ref 1.005–1.030)
pH: 5.5 (ref 5.0–8.0)

## 2021-04-13 LAB — PREGNANCY, URINE: Preg Test, Ur: NEGATIVE

## 2021-04-13 NOTE — ED Provider Notes (Signed)
?[facility] ?Provider Note ? ? ?CSN: [id number] ?Arrival date & time: 04/13/21  2233 ? ?  ? ?History ? ?Chief Complaint  ?Patient presents with  ? Dysuria  ? ? ?Valerie Whitehead is a 46 y.o. female. ? ?Patient presents with chief complaint of burning with urination and a odor from her urine that started yesterday.  She states she has had multiple urinary tract infections since having nephrectomy last year.  She is following up with her urologist.  Otherwise denies any flank pain.  Denies any fevers or cough or vomiting or diarrhea.  Denies any abdominal pain. ? ? ?  ? ?Home Medications ?Prior to Admission medications   ?Medication Sig Start Date End Date Taking? Authorizing Provider  ?cephALEXin (KEFLEX) 500 MG capsule Take 1 capsule (500 mg total) by mouth 3 (three) times daily for 7 days. 04/14/21 04/21/21 Yes Luna Fuse, MD  ?AMLODIPINE BENZOATE PO Take by mouth.    [provider]  ?buPROPion (WELLBUTRIN XL) 300 MG 24 hr tablet Take 300 mg by mouth daily.      [provider]  ?ciprofloxacin (CIPRO) 500 MG tablet Take 1 tablet (500 mg total) by mouth 2 (two) times daily. 01/31/16   Davonna Belling, MD  ?cyclobenzaprine (FLEXERIL) 10 MG tablet Take 1 tablet (10 mg total) by mouth 2 (two) times daily as needed for muscle spasms. 10/08/18   Quintella Reichert, MD  ?famotidine (PEPCID) 20 MG tablet Take 1 tablet (20 mg total) by mouth 2 (two) times daily. 10/08/18   Quintella Reichert, MD  ?hydrochlorothiazide (HYDRODIURIL) 12.5 MG tablet Take 12.5 mg by mouth daily.    [provider]  ?ibuprofen (ADVIL) 800 MG tablet Take 1 tablet (800 mg total) by mouth every 8 (eight) hours as needed. 10/08/18   Quintella Reichert, MD  ?iron polysaccharides (NIFEREX) 150 MG capsule Take by mouth. 09/15/17   [provider]  ?labetalol (NORMODYNE) 300 MG tablet Take 300 mg by mouth 2 (two) times daily. 06/28/15   [provider]  ?lisinopril (ZESTRIL) 20 MG tablet Take  20 mg by mouth daily.    [provider]  ?lisinopril-hydrochlorothiazide (ZESTORETIC) 10-12.5 MG tablet Take 1 tablet by mouth daily.    [provider]  ?losartan (COZAAR) 25 MG tablet Take 25 mg by mouth daily.    [provider]  ?methylPREDNISolone (MEDROL DOSEPAK) 4 MG TBPK tablet Take according to label instructions 10/08/18   Quintella Reichert, MD  ?metoprolol succinate (TOPROL XL) 25 MG 24 hr tablet Take 0.5 tablets (12.5 mg total) by mouth daily. 01/07/18   Little, Wenda Overland, MD  ?omeprazole (PRILOSEC) 20 MG capsule Take 20 mg by mouth daily.    [provider]  ?pantoprazole (PROTONIX) 40 MG tablet Take 40 mg by mouth daily. 05/24/19   [provider]  ?sucralfate (CARAFATE) 1 g tablet Take 1 tablet (1 g total) by mouth 4 (four) times daily -  with meals and at bedtime. 06/02/19   Veryl Speak, MD  ?sucralfate (CARAFATE) 1 GM/10ML suspension Take 10 mLs (1 g total) by mouth 4 (four) times daily -  with meals and at bedtime. 06/10/20   Palumbo, April, MD  ?   ? ?Allergies    ?Patient has no known allergies.   ? ?Review of Systems   ?Review of Systems  ?Constitutional:  Negative for fever.  ?HENT:  Negative for ear pain.   ?Eyes:  Negative for pain.  ?Respiratory:  Negative for cough.   ?  Cardiovascular:  Negative for chest pain.  ?Gastrointestinal:  Negative for abdominal pain.  ?Genitourinary:  Negative for flank pain.  ?Musculoskeletal:  Negative for back pain.  ?Skin:  Negative for rash.  ?Neurological:  Negative for headaches.  ? ?Physical Exam ?Updated Vital Signs ?BP 120/86 (BP Location: Left Arm)   Pulse 88   Temp 98.4 ?F (36.9 ?C) (Oral)   Resp 18   Ht '5\' 8"'$  (1.727 m)   Wt 131.1 kg   LMP 04/10/2021   SpO2 99%   BMI 43.94 kg/m?  ?Physical Exam ?Constitutional:   ?   General: She is not in acute distress. ?   Appearance: Normal appearance.  ?HENT:  ?   Head: Normocephalic.  ?   Nose: Nose normal.  ?Eyes:  ?   Extraocular Movements: Extraocular  movements intact.  ?Cardiovascular:  ?   Rate and Rhythm: Normal rate.  ?Pulmonary:  ?   Effort: Pulmonary effort is normal.  ?Abdominal:  ?   Tenderness: There is no right CVA tenderness or left CVA tenderness.  ?Musculoskeletal:     ?   General: Normal range of motion.  ?   Cervical back: Normal range of motion.  ?Neurological:  ?   General: No focal deficit present.  ?   Mental Status: She is alert. Mental status is at baseline.  ? ? ?ED Results / Procedures / Treatments   ?Labs ?(all labs ordered are listed, but only abnormal results are displayed) ?Labs Reviewed  ?URINALYSIS, ROUTINE W REFLEX MICROSCOPIC - Abnormal; Notable for the following components:  ?    Result Value  ? APPearance CLOUDY (*)   ? Hgb urine dipstick LARGE (*)   ? Protein, ur 100 (*)   ? Leukocytes,Ua MODERATE (*)   ? All other components within normal limits  ?URINALYSIS, MICROSCOPIC (REFLEX) - Abnormal; Notable for the following components:  ? Bacteria, UA MANY (*)   ? Non Squamous Epithelial PRESENT (*)   ? All other components within normal limits  ?URINE CULTURE  ?PREGNANCY, URINE  ? ? ?EKG ?None ? ?Radiology ?No results found. ? ?Procedures ?Procedures  ? ? ?Medications Ordered in ED ?Medications  ?cefTRIAXone (ROCEPHIN) injection 1 g (has no administration in time range)  ? ? ?ED Course/ Medical Decision Making/ A&P ?  ?                        ?Medical Decision Making ?Amount and/or Complexity of Data Reviewed ?Independent Historian: parent ?External Data Reviewed: notes. ?   Details: Urology outpatient visit 04/08/2021. ?Labs: ordered. ?   Details: Urinalysis urine pregnant.  Urinalysis positive for UTI. ? ?Risk ?Prescription drug management. ?Risk Details: I considered pyelonephritis and sepsis.  However the patient has no flank pain no abdominal pain no fevers reported and only the second day of symptoms of dysuria.  She has a urologist with whom I advised close follow-up within the week. ? ? ? ? ? ? ? ? ? ? ?Final Clinical  Impression(s) / ED Diagnoses ?Final diagnoses:  ?Acute cystitis with hematuria  ? ? ?Rx / DC Orders ?ED Discharge Orders   ? ?      Ordered  ?  cephALEXin (KEFLEX) 500 MG capsule  3 times daily       ? 04/14/21 0002  ? ?  ?  ? ?  ? ? ?  ?Luna Fuse, MD ?04/14/21 0003 ? ?

## 2021-04-13 NOTE — ED Notes (Signed)
ED Provider at bedside. 

## 2021-04-13 NOTE — ED Triage Notes (Signed)
Reports burning on urination with an odor that started yesterday.  Hx of nephrectomy last year.  Reports multiple UTI's since. ?

## 2021-04-14 MED ORDER — CEPHALEXIN 500 MG PO CAPS: 500.0000 mg | ORAL_CAPSULE | Freq: Three times a day (TID) | ORAL | 0 refills | Status: AC

## 2021-04-14 MED ORDER — LIDOCAINE HCL (PF) 1 % IJ SOLN
2.0000 mL | Freq: Once | INTRAMUSCULAR | Status: AC
  Administered 2021-04-14: 2 mL
  Filled 2021-04-14: qty 5

## 2021-04-14 MED ORDER — CEFTRIAXONE SODIUM 1 G IJ SOLR
1.0000 g | Freq: Once | INTRAMUSCULAR | Status: AC
  Administered 2021-04-14: 1 g via INTRAMUSCULAR
  Filled 2021-04-14: qty 10

## 2021-04-14 NOTE — Discharge Instructions (Signed)
Call your primary care doctor or specialist as discussed in the next 2-3 days.   Return immediately back to the ER if:  Your symptoms worsen within the next 12-24 hours. You develop new symptoms such as new fevers, persistent vomiting, new pain, shortness of breath, or new weakness or numbness, or if you have any other concerns.  

## 2021-04-16 LAB — URINE CULTURE: Culture: 40000 — AB

## 2021-07-24 ENCOUNTER — Emergency Department (HOSPITAL_BASED_OUTPATIENT_CLINIC_OR_DEPARTMENT_OTHER): Payer: PRIVATE HEALTH INSURANCE

## 2021-07-24 ENCOUNTER — Emergency Department (HOSPITAL_BASED_OUTPATIENT_CLINIC_OR_DEPARTMENT_OTHER)
Admission: EM | Admit: 2021-07-24 | Discharge: 2021-07-25 | Disposition: A | Discharge status: Home / Self Care | Payer: PRIVATE HEALTH INSURANCE | Attending: Emergency Medicine | Admitting: Emergency Medicine

## 2021-07-24 ENCOUNTER — Encounter (HOSPITAL_BASED_OUTPATIENT_CLINIC_OR_DEPARTMENT_OTHER): Payer: Self-pay | Admitting: Emergency Medicine

## 2021-07-24 ENCOUNTER — Other Ambulatory Visit: Payer: Self-pay

## 2021-07-24 DIAGNOSIS — R10A2 Flank pain, left side: Secondary | ICD-10-CM

## 2021-07-24 DIAGNOSIS — R109 Unspecified abdominal pain: Secondary | ICD-10-CM | POA: Diagnosis present

## 2021-07-24 DIAGNOSIS — R300 Dysuria: Secondary | ICD-10-CM | POA: Diagnosis not present

## 2021-07-24 DIAGNOSIS — R7989 Other specified abnormal findings of blood chemistry: Secondary | ICD-10-CM | POA: Diagnosis not present

## 2021-07-24 LAB — CBC WITH DIFFERENTIAL/PLATELET
Abs Immature Granulocytes: 0.03 10*3/uL (ref 0.00–0.07)
Basophils Absolute: 0.1 10*3/uL (ref 0.0–0.1)
Basophils Relative: 1 %
Eosinophils Absolute: 0.2 10*3/uL (ref 0.0–0.5)
Eosinophils Relative: 2 %
HCT: 37.9 % (ref 36.0–46.0)
Hemoglobin: 12.6 g/dL (ref 12.0–15.0)
Immature Granulocytes: 0 %
Lymphocytes Relative: 23 %
Lymphs Abs: 2.1 10*3/uL (ref 0.7–4.0)
MCH: 29.4 pg (ref 26.0–34.0)
MCHC: 33.2 g/dL (ref 30.0–36.0)
MCV: 88.6 fL (ref 80.0–100.0)
Monocytes Absolute: 0.7 10*3/uL (ref 0.1–1.0)
Monocytes Relative: 7 %
Neutro Abs: 6.3 10*3/uL (ref 1.7–7.7)
Neutrophils Relative %: 67 %
Platelets: 352 10*3/uL (ref 150–400)
RBC: 4.28 MIL/uL (ref 3.87–5.11)
RDW: 13.8 % (ref 11.5–15.5)
WBC: 9.3 10*3/uL (ref 4.0–10.5)
nRBC: 0 % (ref 0.0–0.2)

## 2021-07-24 LAB — BASIC METABOLIC PANEL
Anion gap: 7 (ref 5–15)
BUN: 17 mg/dL (ref 6–20)
CO2: 27 mmol/L (ref 22–32)
Calcium: 9.3 mg/dL (ref 8.9–10.3)
Chloride: 103 mmol/L (ref 98–111)
Creatinine, Ser: 1.36 mg/dL — ABNORMAL HIGH (ref 0.44–1.00)
GFR, Estimated: 50 mL/min — ABNORMAL LOW (ref >60)
Glucose, Bld: 97 mg/dL (ref 70–99)
Potassium: 3.8 mmol/L (ref 3.5–5.1)
Sodium: 137 mmol/L (ref 135–145)

## 2021-07-24 LAB — PREGNANCY, URINE: Preg Test, Ur: NEGATIVE

## 2021-07-24 LAB — URINALYSIS, ROUTINE W REFLEX MICROSCOPIC
Bilirubin Urine: NEGATIVE
Glucose, UA: NEGATIVE mg/dL
Hgb urine dipstick: NEGATIVE
Ketones, ur: NEGATIVE mg/dL
Leukocytes,Ua: NEGATIVE
Nitrite: NEGATIVE
Protein, ur: NEGATIVE mg/dL
Specific Gravity, Urine: 1.025 (ref 1.005–1.030)
pH: 5.5 (ref 5.0–8.0)

## 2021-07-24 MED ORDER — IOHEXOL 300 MG/ML  SOLN
125.0000 mL | Freq: Once | INTRAMUSCULAR | Status: AC | PRN
  Administered 2021-07-24: 125 mL via INTRAVENOUS

## 2021-07-24 NOTE — ED Triage Notes (Signed)
Pt is c/o pain and burning in her left flank x 3-4 days  Pt states she had a partial nephrectomy on that side

## 2021-07-24 NOTE — ED Provider Notes (Signed)
[facility] Provider Note   CSN: [id number] Arrival date & time: 07/24/21  2020     History  Chief Complaint  Patient presents with   Flank Pain    Valerie Whitehead is a 46 y.o. female.  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  Patient admits to dysuria.  Denies any hematuria.  Denies any history of renal stones.  Nuys any fevers, chills, nausea, vomiting.  The history is provided by the patient. No language interpreter was used.  Flank Pain Pertinent negatives include no chest pain, no abdominal pain and no shortness of breath.       Home Medications Prior to Admission medications   Medication Sig Start Date End Date Taking? Authorizing Provider  AMLODIPINE BENZOATE PO Take by mouth.    [provider]  buPROPion (WELLBUTRIN XL) 300 MG 24 hr tablet Take 300 mg by mouth daily.      [provider]  ciprofloxacin (CIPRO) 500 MG tablet Take 1 tablet (500 mg total) by mouth 2 (two) times daily. 01/31/16   Davonna Belling, MD  cyclobenzaprine (FLEXERIL) 10 MG tablet Take 1 tablet (10 mg total) by mouth 2 (two) times daily as needed for muscle spasms. 10/08/18   Quintella Reichert, MD  famotidine (PEPCID) 20 MG tablet Take 1 tablet (20 mg total) by mouth 2 (two) times daily. 10/08/18   Quintella Reichert, MD  hydrochlorothiazide (HYDRODIURIL) 12.5 MG tablet Take 12.5 mg by mouth daily.    [provider]  ibuprofen (ADVIL) 800 MG tablet Take 1 tablet (800 mg total) by mouth every 8 (eight) hours as needed. 10/08/18   Quintella Reichert, MD  iron polysaccharides (NIFEREX) 150 MG capsule Take by mouth. 09/15/17   [provider]  labetalol (NORMODYNE) 300 MG tablet Take 300 mg by mouth 2 (two) times daily. 06/28/15   [provider]  lisinopril (ZESTRIL) 20 MG tablet Take 20 mg by mouth daily.    [provider]  lisinopril-hydrochlorothiazide (ZESTORETIC) 10-12.5 MG tablet Take 1 tablet by mouth daily.    [provider]  losartan (COZAAR) 25 MG tablet Take 25 mg by mouth daily.    [provider]  methylPREDNISolone (MEDROL DOSEPAK) 4 MG TBPK tablet Take according to label instructions 10/08/18   Quintella Reichert, MD  metoprolol succinate (TOPROL XL) 25 MG 24 hr tablet Take 0.5 tablets (12.5 mg total) by mouth daily. 01/07/18   Little, Wenda Overland, MD  omeprazole (PRILOSEC) 20 MG capsule Take 20 mg by mouth daily.    [provider]  pantoprazole (PROTONIX) 40 MG tablet Take 40 mg by mouth daily. 05/24/19   [provider]  sucralfate (CARAFATE) 1 g tablet Take 1 tablet (1 g total) by mouth 4 (four) times daily -  with meals and at bedtime. 06/02/19   Veryl Speak, MD  sucralfate (CARAFATE) 1 GM/10ML suspension Take 10 mLs (1 g total) by mouth 4 (four) times daily -  with meals and at bedtime. 06/10/20   Palumbo, April, MD      Allergies    Patient has no known allergies.    Review of Systems   Review of Systems  Constitutional:  Negative for chills and fever.  HENT:  Negative for ear pain and sore throat.   Eyes:  Negative for pain and visual disturbance.  Respiratory:  Negative for cough and shortness of breath.   Cardiovascular:  Negative for chest pain and palpitations.  Gastrointestinal:  Negative for abdominal pain and vomiting.  Genitourinary:  Positive for dysuria and flank pain. Negative for hematuria.  Musculoskeletal:  Negative for arthralgias and back pain.  Skin:  Negative for color change and rash.  Neurological:  Negative for seizures and syncope.  All other systems reviewed and are negative.   Physical Exam Updated Vital Signs BP 129/84   Pulse 85   Temp 97.9 F (36.6 C) (Oral)   Resp 18   Ht '5\' 8"'$  (1.727 m)   Wt 121.1 kg   LMP 07/08/2021 (Approximate)   SpO2 99%   BMI 40.60 kg/m  Physical Exam  ED Results /  Procedures / Treatments   Labs (all labs ordered are listed, but only abnormal results are displayed) Labs Reviewed  BASIC METABOLIC PANEL - Abnormal; Notable for the following components:      Result Value   Creatinine, Ser 1.36 (*)    GFR, Estimated 50 (*)    All other components within normal limits  URINALYSIS, ROUTINE W REFLEX MICROSCOPIC  PREGNANCY, URINE  CBC WITH DIFFERENTIAL/PLATELET    EKG None  Radiology No results found.  Procedures Procedures    Medications Ordered in ED Medications - No data to display  ED Course/ Medical Decision Making/ A&P                           Medical Decision Making Amount and/or Complexity of Data Reviewed Labs: ordered. Radiology: ordered.   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  Patient is alert and oriented x3, tearful on exam, afebrile, stable vital signs.  Concerned that renal malignancy is back.  UA demonstrates no UTI.  Laboratory studies demonstrate creatinine 1.31.  CT abdomen pending.  Patient signed out to oncoming provider while awaiting results.           Final Clinical Impression(s) / ED Diagnoses Final diagnoses:  Left flank pain    Rx / DC Orders ED Discharge Orders     None         Lianne Cure, DO 08/05/92 2329

## 2021-07-25 NOTE — Discharge Instructions (Signed)
Follow up with your doctor.    Your CT scan did not show a concerning problem with your kidney.  No signs of obvious cancer.  There were signs that you could be a little bit constipated.  Take 8 scoops of miralax in 32oz of whatever you would like to drink.(Gatorade comes in this size) You can also use a fleets enema which you can buy over the counter at the pharmacy.  Return for worsening abdominal pain, vomiting or fever.

## 2021-07-25 NOTE — ED Provider Notes (Signed)
I received the patient in signout from Dr. Pearline Cables, briefly the patient is a 46 year old female with a chief complaint of abdominal discomfort.  Plan for CT imaging.  If negative would discharge home.  Patient CT scan is resulted without acute intra-abdominal pathology.  It does show an increase stool burden.  We will have her do a trial of MiraLAX at home.  PCP follow-up.   Deno Etienne, DO 07/25/21 [phone]

## 2021-10-17 ENCOUNTER — Emergency Department (HOSPITAL_BASED_OUTPATIENT_CLINIC_OR_DEPARTMENT_OTHER): Payer: PRIVATE HEALTH INSURANCE

## 2021-10-17 ENCOUNTER — Encounter (HOSPITAL_BASED_OUTPATIENT_CLINIC_OR_DEPARTMENT_OTHER): Payer: Self-pay | Admitting: Emergency Medicine

## 2021-10-17 ENCOUNTER — Other Ambulatory Visit: Payer: Self-pay

## 2021-10-17 ENCOUNTER — Emergency Department (HOSPITAL_BASED_OUTPATIENT_CLINIC_OR_DEPARTMENT_OTHER)
Admission: EM | Admit: 2021-10-17 | Discharge: 2021-10-17 | Disposition: A | Discharge status: Home / Self Care | Payer: PRIVATE HEALTH INSURANCE | Attending: Emergency Medicine | Admitting: Emergency Medicine

## 2021-10-17 DIAGNOSIS — M545 Low back pain, unspecified: Secondary | ICD-10-CM | POA: Diagnosis present

## 2021-10-17 DIAGNOSIS — Z79899 Other long term (current) drug therapy: Secondary | ICD-10-CM | POA: Diagnosis not present

## 2021-10-17 DIAGNOSIS — R3915 Urgency of urination: Secondary | ICD-10-CM | POA: Diagnosis not present

## 2021-10-17 DIAGNOSIS — M549 Dorsalgia, unspecified: Secondary | ICD-10-CM

## 2021-10-17 DIAGNOSIS — I10 Essential (primary) hypertension: Secondary | ICD-10-CM | POA: Diagnosis not present

## 2021-10-17 DIAGNOSIS — Z85528 Personal history of other malignant neoplasm of kidney: Secondary | ICD-10-CM | POA: Insufficient documentation

## 2021-10-17 LAB — URINALYSIS, ROUTINE W REFLEX MICROSCOPIC
Bilirubin Urine: NEGATIVE
Glucose, UA: NEGATIVE mg/dL
Ketones, ur: NEGATIVE mg/dL
Nitrite: NEGATIVE
Protein, ur: NEGATIVE mg/dL
Specific Gravity, Urine: 1.02 (ref 1.005–1.030)
pH: 5 (ref 5.0–8.0)

## 2021-10-17 LAB — COMPREHENSIVE METABOLIC PANEL
ALT: 22 U/L (ref 0–44)
AST: 17 U/L (ref 15–41)
Albumin: 3.4 g/dL — ABNORMAL LOW (ref 3.5–5.0)
Alkaline Phosphatase: 74 U/L (ref 38–126)
Anion gap: 6 (ref 5–15)
BUN: 18 mg/dL (ref 6–20)
CO2: 25 mmol/L (ref 22–32)
Calcium: 8.9 mg/dL (ref 8.9–10.3)
Chloride: 105 mmol/L (ref 98–111)
Creatinine, Ser: 1.37 mg/dL — ABNORMAL HIGH (ref 0.44–1.00)
GFR, Estimated: 49 mL/min — ABNORMAL LOW (ref >60)
Glucose, Bld: 98 mg/dL (ref 70–99)
Potassium: 3.5 mmol/L (ref 3.5–5.1)
Sodium: 136 mmol/L (ref 135–145)
Total Bilirubin: 0.5 mg/dL (ref 0.3–1.2)
Total Protein: 7.1 g/dL (ref 6.5–8.1)

## 2021-10-17 LAB — CBC WITH DIFFERENTIAL/PLATELET
Abs Immature Granulocytes: 0.01 10*3/uL (ref 0.00–0.07)
Basophils Absolute: 0 10*3/uL (ref 0.0–0.1)
Basophils Relative: 1 %
Eosinophils Absolute: 0.1 10*3/uL (ref 0.0–0.5)
Eosinophils Relative: 2 %
HCT: 37.8 % (ref 36.0–46.0)
Hemoglobin: 12.5 g/dL (ref 12.0–15.0)
Immature Granulocytes: 0 %
Lymphocytes Relative: 26 %
Lymphs Abs: 1.5 10*3/uL (ref 0.7–4.0)
MCH: 29.3 pg (ref 26.0–34.0)
MCHC: 33.1 g/dL (ref 30.0–36.0)
MCV: 88.5 fL (ref 80.0–100.0)
Monocytes Absolute: 0.4 10*3/uL (ref 0.1–1.0)
Monocytes Relative: 7 %
Neutro Abs: 3.7 10*3/uL (ref 1.7–7.7)
Neutrophils Relative %: 64 %
Platelets: 318 10*3/uL (ref 150–400)
RBC: 4.27 MIL/uL (ref 3.87–5.11)
RDW: 13.5 % (ref 11.5–15.5)
WBC: 5.6 10*3/uL (ref 4.0–10.5)
nRBC: 0 % (ref 0.0–0.2)

## 2021-10-17 LAB — PREGNANCY, URINE: Preg Test, Ur: NEGATIVE

## 2021-10-17 MED ORDER — METHOCARBAMOL 500 MG PO TABS: 500.0000 mg | ORAL_TABLET | Freq: Three times a day (TID) | ORAL | 0 refills | Status: DC | PRN

## 2021-10-17 MED ORDER — CEFTRIAXONE SODIUM 1 G IJ SOLR
1.0000 g | Freq: Once | INTRAMUSCULAR | Status: AC
Start: 2021-10-17 — End: 2021-10-17
  Administered 2021-10-17: 1 g via INTRAVENOUS
  Filled 2021-10-17: qty 10

## 2021-10-17 MED ORDER — SODIUM CHLORIDE 0.9 % IV BOLUS
1000.0000 mL | Freq: Once | INTRAVENOUS | Status: AC
  Administered 2021-10-17: 1000 mL via INTRAVENOUS

## 2021-10-17 MED ORDER — ACETAMINOPHEN 325 MG PO TABS
650.0000 mg | ORAL_TABLET | Freq: Once | ORAL | Status: AC
Start: 2021-10-17 — End: 2021-10-17
  Administered 2021-10-17: 650 mg via ORAL
  Filled 2021-10-17: qty 2

## 2021-10-17 MED ORDER — ONDANSETRON HCL 4 MG/2ML IJ SOLN
4.0000 mg | Freq: Once | INTRAMUSCULAR | Status: AC
  Administered 2021-10-17: 4 mg via INTRAVENOUS
  Filled 2021-10-17: qty 2

## 2021-10-17 MED ORDER — CEFPODOXIME PROXETIL 200 MG PO TABS: 200.0000 mg | ORAL_TABLET | Freq: Two times a day (BID) | ORAL | 0 refills | Status: DC

## 2021-10-17 MED ORDER — CEFPODOXIME PROXETIL 200 MG PO TABS
200.0000 mg | ORAL_TABLET | Freq: Two times a day (BID) | ORAL | 0 refills | Status: AC
Start: 2021-10-17 — End: 2021-10-27

## 2021-10-17 NOTE — ED Triage Notes (Signed)
Urinary symptoms , urgency and dysuria x 3 days , Hx UTI, bilateral flank pain . Hx left nephrectomy .

## 2021-10-17 NOTE — ED Notes (Signed)
Pt discharged to home. Discharge instructions have been discussed with patient and/or family members. Pt verbally acknowledges understanding d/c instructions, and endorses comprehension to checkout at registration before leaving.  °

## 2021-10-17 NOTE — ED Notes (Signed)
Lab notified to add-on urine culture to previously submitted urine sample.  

## 2021-10-17 NOTE — ED Notes (Signed)
Pt ambulatory to bathroom, standby assistance.  

## 2021-10-17 NOTE — Discharge Instructions (Addendum)
You were seen in the ER today for back pain.  Your CT scan did not show any significant abnormalities.  Your labs showed your GFR is mildly low- please follow up with nephrology as planned regarding this.   We are treating you for a kidney infection with cefpodoxime- please take this as prescribed starting tonight.  We are also sending you home with robaxin to take as needed for pain.   Robaxin- this is the muscle relaxer I have prescribed, this is meant to help with muscle tightness/spasms. Be aware that this medication may make you drowsy therefore the first time you take this it should be at a time you are in an environment where you can rest. Do not drive or operate heavy machinery when taking this medication. Do not drink alcohol or take other sedating medications with this medicine such as narcotics or benzodiazepines.   You make take Tylenol per over the counter dosing with these medications.   We have prescribed you new medication(s) today. Discuss the medications prescribed today with your pharmacist as they can have adverse effects and interactions with your other medicines including over the counter and prescribed medications. Seek medical evaluation if you start to experience new or abnormal symptoms after taking one of these medicines, seek care immediately if you start to experience difficulty breathing, feeling of your throat closing, facial swelling, or rash as these could be indications of a more serious allergic reaction  Follow up with primary care in 3 days.  Return to the ED for any new or worsening symptoms including but not limited to new or worsening pain, fever, inability to keep fluids down, numbness/weakness in your legs, trouble walking, or any other concerns.

## 2021-10-17 NOTE — ED Provider Notes (Signed)
[facility] Provider Note   CSN: [id number] Arrival date & time: 10/17/21  1113     History  Chief Complaint  Patient presents with   Urinary Urgency    Valerie Whitehead is a 46 y.o. female with a hx of renal carcinoma S/p partial left nephrectomy, IBS, hypertension & anemia who presents to the ED with complaints of urinary sxs x 1 week and left flank pain x 3 days. Reports dysuria, urgency, frequency, subsequently developed left flank pain that moves to the right flank at times. Today noted blood tinged urine. Hx of UTIs, feels similar however flank pain is new. Most recently on doxy for UTI 1 month prior. Has had some nausea. Denies fever, vomiting, diarrhea,  vaginal discharge, concern for STI or abdominal pain. Denies numbness or weakness. Currently menstruating.   HPI     Home Medications Prior to Admission medications   Medication Sig Start Date End Date Taking? Authorizing Provider  AMLODIPINE BENZOATE PO Take by mouth.    [provider]  buPROPion (WELLBUTRIN XL) 300 MG 24 hr tablet Take 300 mg by mouth daily.      [provider]  ciprofloxacin (CIPRO) 500 MG tablet Take 1 tablet (500 mg total) by mouth 2 (two) times daily. 01/31/16   Davonna Belling, MD  cyclobenzaprine (FLEXERIL) 10 MG tablet Take 1 tablet (10 mg total) by mouth 2 (two) times daily as needed for muscle spasms. 10/08/18   Quintella Reichert, MD  famotidine (PEPCID) 20 MG tablet Take 1 tablet (20 mg total) by mouth 2 (two) times daily. 10/08/18   Quintella Reichert, MD  hydrochlorothiazide (HYDRODIURIL) 12.5 MG tablet Take 12.5 mg by mouth daily.    [provider]  ibuprofen (ADVIL) 800 MG tablet Take 1 tablet (800 mg total) by mouth every 8 (eight) hours as needed. 10/08/18   Quintella Reichert, MD  iron polysaccharides (NIFEREX) 150 MG capsule Take by mouth. 09/15/17   [provider]  labetalol (NORMODYNE) 300 MG tablet Take 300 mg by mouth 2 (two)  times daily. 06/28/15   [provider]  lisinopril (ZESTRIL) 20 MG tablet Take 20 mg by mouth daily.    [provider]  lisinopril-hydrochlorothiazide (ZESTORETIC) 10-12.5 MG tablet Take 1 tablet by mouth daily.    [provider]  losartan (COZAAR) 25 MG tablet Take 25 mg by mouth daily.    [provider]  methylPREDNISolone (MEDROL DOSEPAK) 4 MG TBPK tablet Take according to label instructions 10/08/18   Quintella Reichert, MD  metoprolol succinate (TOPROL XL) 25 MG 24 hr tablet Take 0.5 tablets (12.5 mg total) by mouth daily. 01/07/18   Little, Wenda Overland, MD  omeprazole (PRILOSEC) 20 MG capsule Take 20 mg by mouth daily.    [provider]  pantoprazole (PROTONIX) 40 MG tablet Take 40 mg by mouth daily. 05/24/19   [provider]  sucralfate (CARAFATE) 1 g tablet Take 1 tablet (1 g total) by mouth 4 (four) times daily -  with meals and at bedtime. 06/02/19   Veryl Speak, MD  sucralfate (CARAFATE) 1 GM/10ML suspension Take 10 mLs (1 g total) by mouth 4 (four) times daily -  with meals and at bedtime. 06/10/20   Palumbo, April, MD      Allergies    Patient has no known allergies.    Review of Systems   Review of Systems  Constitutional:  Negative for chills and fever.  Respiratory:  Negative for shortness of breath.  Cardiovascular:  Negative for chest pain.  Gastrointestinal:  Positive for nausea. Negative for abdominal pain, blood in stool, diarrhea and vomiting.  Genitourinary:  Positive for dysuria, flank pain, frequency, hematuria, urgency and vaginal bleeding (menses). Negative for vaginal discharge.  Neurological:  Negative for weakness and numbness.  All other systems reviewed and are negative.   Physical Exam Updated Vital Signs BP 125/89   Pulse 84   Temp 98 F (36.7 C) (Oral)   Resp 20   Ht '5\' 8"'$  (1.727 m)   Wt 123.8 kg   LMP 10/14/2021 (Exact Date)   SpO2 100%   BMI 41.51 kg/m  Physical Exam Vitals and nursing  note reviewed.  Constitutional:      General: She is not in acute distress.    Appearance: She is well-developed. She is not toxic-appearing.  HENT:     Head: Normocephalic and atraumatic.  Eyes:     General:        Right eye: No discharge.        Left eye: No discharge.     Conjunctiva/sclera: Conjunctivae normal.  Cardiovascular:     Rate and Rhythm: Normal rate and regular rhythm.  Pulmonary:     Effort: No respiratory distress.     Breath sounds: Normal breath sounds. No wheezing or rales.  Abdominal:     General: There is no distension.     Palpations: Abdomen is soft.     Tenderness: There is no abdominal tenderness. There is left CVA tenderness. There is no right CVA tenderness, guarding or rebound.  Musculoskeletal:     Cervical back: Neck supple.     Comments: Back: No midline spinal tenderness. Some left lumbar paraspinal muscle tenderness.  Moving lower extremities without difficulty.   Skin:    General: Skin is warm and dry.  Neurological:     Mental Status: She is alert.     Comments: Clear speech. Sensation grossly intact to lower extremities. Ambulatory.   Psychiatric:        Behavior: Behavior normal.     ED Results / Procedures / Treatments   Labs (all labs ordered are listed, but only abnormal results are displayed) Labs Reviewed  URINALYSIS, ROUTINE W REFLEX MICROSCOPIC - Abnormal; Notable for the following components:      Result Value   APPearance HAZY (*)    Hgb urine dipstick LARGE (*)    Leukocytes,Ua SMALL (*)    All other components within normal limits  COMPREHENSIVE METABOLIC PANEL - Abnormal; Notable for the following components:   Creatinine, Ser 1.37 (*)    Albumin 3.4 (*)    GFR, Estimated 49 (*)    All other components within normal limits  URINALYSIS, MICROSCOPIC (REFLEX) - Abnormal; Notable for the following components:   Bacteria, UA FEW (*)    All other components within normal limits  URINE CULTURE  PREGNANCY, URINE  CBC WITH  DIFFERENTIAL/PLATELET    EKG None  Radiology CT Renal Stone Study  Result Date: 10/17/2021 CLINICAL DATA:  Kidney stone suspected. Three days of urinary frequency and dysuria. History of partial nephrectomy. EXAM: CT ABDOMEN AND PELVIS WITHOUT CONTRAST TECHNIQUE: Multidetector CT imaging of the abdomen and pelvis was performed following the standard protocol without IV contrast. RADIATION DOSE REDUCTION: This exam was performed according to the departmental dose-optimization program which includes automated exposure control, adjustment of the mA and/or kV according to patient size and/or use of iterative reconstruction technique. COMPARISON:  CT 07/24/2021 FINDINGS: Lower chest: No  acute abnormality. Hepatobiliary: No focal liver abnormality is seen. Status post cholecystectomy. No biliary dilatation. Pancreas: Unremarkable. No pancreatic ductal dilatation or surrounding inflammatory changes. Spleen: Normal in size without focal abnormality. Adrenals/Urinary Tract: Adrenal glands are unremarkable. Postoperative changes of left nephrectomy. The right kidney is normal without renal calculi, focal lesion, or hydronephrosis. Bladder is unremarkable. Stomach/Bowel: Stomach is within normal limits. Appendix appears normal. No evidence of bowel wall thickening, distention, or inflammatory changes. Vascular/Lymphatic: No significant vascular findings are present. No enlarged abdominal or pelvic lymph nodes. Reproductive: Uterus and bilateral adnexa are unremarkable. Other: No abdominal wall hernia or abnormality. No abdominopelvic ascites. Musculoskeletal: Advanced degenerative disc disease at L5-S1 with vacuum disc phenomenon. No acute or suspicious osseous findings. IMPRESSION: No acute abnormality in the abdomen or pelvis. Specifically, no findings of nephrolithiasis as queried. Electronically Signed   By: Ileana Roup M.D.   On: 10/17/2021 14:32    Procedures Procedures    Medications Ordered in  ED Medications  sodium chloride 0.9 % bolus 1,000 mL (0 mLs Intravenous Stopped 10/17/21 1523)  ondansetron (ZOFRAN) injection 4 mg (4 mg Intravenous Given 10/17/21 1304)  acetaminophen (TYLENOL) tablet 650 mg (650 mg Oral Given 10/17/21 1304)  cefTRIAXone (ROCEPHIN) 1 g in sodium chloride 0.9 % 100 mL IVPB (1 g Intravenous New Bag/Given 10/17/21 1519)    ED Course/ Medical Decision Making/ A&P                           Medical Decision Making Amount and/or Complexity of Data Reviewed Labs: ordered. Radiology: ordered.  Risk OTC drugs. Prescription drug management.  Patient presents to the ED with complaints of back/flank pain w/ urinary sxs, this involves an extensive number of treatment options, and is a complaint that carries with it a high risk of complications and morbidity. Nontoxic, vitals fairly unremarkable.   Additional history obtained:  Chart/nursing notes reviewed  Lab Tests:  I viewed & interpreted labs including:  CBC: Unremarkable.  CMP: elevated creatinine/decreased GFR- fairly similar to prior.  Preg test: Negative UA: some findings concerning for infection.   Imaging Studies:  I ordered and viewed the following imaging, agree with radiologist impression: No acute abnormality in the abdomen or pelvis. Specifically, no findings of nephrolithiasis as queried.  ED Course:  I ordered medications including fluids, Tylenol, and Zofran for symptomatic management.  Urine with some findings suspicious for infection, given her urinary symptoms as well as for left flank pain will treat for pyelonephritis with Rocephin in the ED and cefpodoxime to go home with.  Patient does not appear toxic or septic, does not appear to require admission at this time.  Also considering contributory muscular pain given patient did state that she tried to hang a TV, will trial Robaxin.  Ambulatory without neurologic deficits suggest acute cord compression or cauda equina syndrome.  CT renal stone  study and labs without significant abnormalities from baseline.  Overall reassuring work-up in the emergency department, patient feeling symptomatically improved, seems reasonable for discharge. I discussed results, treatment plan, need for follow-up, and return precautions with the patient. Provided opportunity for questions, patient confirmed understanding and is in agreement with plan.   Portions of this note were generated with [employer]. Dictation errors may occur despite best attempts at proofreading.  Final Clinical Impression(s) / ED Diagnoses Final diagnoses:  Acute left-sided back pain, unspecified back location    Rx / DC Orders ED Discharge Orders  Ordered     10/17/21 1613     10/17/21 1613    cefpodoxime (VANTIN) 200 MG tablet  2 times daily        10/17/21 1629    methocarbamol (ROBAXIN) 500 MG tablet  Every 8 hours PRN        10/17/21 1629              Jaleigh Mccroskey, [place] R, PA-C 10/17/21 1647    Margette Fast, MD 10/18/21 2332

## 2021-10-19 LAB — URINE CULTURE: Culture: >=100000 — AB

## 2021-10-20 ENCOUNTER — Telehealth (HOSPITAL_BASED_OUTPATIENT_CLINIC_OR_DEPARTMENT_OTHER): Payer: Self-pay | Admitting: *Deleted

## 2021-10-20 NOTE — Telephone Encounter (Signed)
Post ED Visit - Positive Culture Follow-up  Culture report reviewed by antimicrobial stewardship pharmacist: [facility] '[]'$  Elenor Quinones, Pharm.D. '[]'$  Heide Guile, Pharm.D., BCPS AQ-ID '[]'$  Parks Neptune, Pharm.D., BCPS '[]'$  Alycia Rossetti, Pharm.D., BCPS '[]'$  [place], Pharm.D., BCPS, AAHIVP '[]'$  Legrand Como, Pharm.D., BCPS, AAHIVP '[]'$  Salome Arnt, PharmD, BCPS '[]'$  Johnnette Gourd, PharmD, BCPS '[]'$  Hughes Better, PharmD, BCPS '[]'$  Leeroy Cha, PharmD '[]'$  Laqueta Linden, PharmD, BCPS '[x]'$  Albertina Parr, PharmD  [facility] Team '[]'$  Leodis Sias, PharmD '[]'$  Lindell Spar, PharmD '[]'$  Royetta Asal, PharmD '[]'$  Graylin Shiver, Rph '[]'$  Rema Fendt) Glennon Mac, PharmD '[]'$  Arlyn Dunning, PharmD '[]'$  Netta Cedars, PharmD '[]'$  Dia Sitter, PharmD '[]'$  Leone Haven, PharmD '[]'$  Gretta Arab, PharmD '[]'$  Theodis Shove, PharmD '[]'$  Peggyann Juba, PharmD '[]'$  Reuel Boom, PharmD   Positive urine culture Treated with Cefpodoxime Proxetil, organism sensitive to the same and no further patient follow-up is required at this time.  Rosie Fate 10/20/2021, 4:12 PM

## 2021-10-22 LAB — URINALYSIS, MICROSCOPIC (REFLEX): RBC / HPF: >50 RBC/hpf (ref 0–5)

## 2021-12-03 ENCOUNTER — Encounter (HOSPITAL_BASED_OUTPATIENT_CLINIC_OR_DEPARTMENT_OTHER): Payer: Self-pay | Admitting: Pediatrics

## 2021-12-03 ENCOUNTER — Emergency Department (HOSPITAL_BASED_OUTPATIENT_CLINIC_OR_DEPARTMENT_OTHER)
Admission: EM | Admit: 2021-12-03 | Discharge: 2021-12-03 | Disposition: A | Discharge status: Home / Self Care | Payer: Self-pay | Attending: Emergency Medicine | Admitting: Emergency Medicine

## 2021-12-03 ENCOUNTER — Other Ambulatory Visit: Payer: Self-pay

## 2021-12-03 ENCOUNTER — Emergency Department (HOSPITAL_BASED_OUTPATIENT_CLINIC_OR_DEPARTMENT_OTHER): Payer: PRIVATE HEALTH INSURANCE

## 2021-12-03 DIAGNOSIS — R0789 Other chest pain: Secondary | ICD-10-CM | POA: Insufficient documentation

## 2021-12-03 LAB — LIPASE, BLOOD: Lipase: 43 U/L (ref 11–51)

## 2021-12-03 LAB — BASIC METABOLIC PANEL
Anion gap: 6 (ref 5–15)
BUN: 15 mg/dL (ref 6–20)
CO2: 24 mmol/L (ref 22–32)
Calcium: 8.8 mg/dL — ABNORMAL LOW (ref 8.9–10.3)
Chloride: 107 mmol/L (ref 98–111)
Creatinine, Ser: 1.2 mg/dL — ABNORMAL HIGH (ref 0.44–1.00)
GFR, Estimated: 58 mL/min — ABNORMAL LOW (ref >60)
Glucose, Bld: 120 mg/dL — ABNORMAL HIGH (ref 70–99)
Potassium: 3.2 mmol/L — ABNORMAL LOW (ref 3.5–5.1)
Sodium: 137 mmol/L (ref 135–145)

## 2021-12-03 LAB — CBC
HCT: 35.8 % — ABNORMAL LOW (ref 36.0–46.0)
Hemoglobin: 12 g/dL (ref 12.0–15.0)
MCH: 29.7 pg (ref 26.0–34.0)
MCHC: 33.5 g/dL (ref 30.0–36.0)
MCV: 88.6 fL (ref 80.0–100.0)
Platelets: 329 10*3/uL (ref 150–400)
RBC: 4.04 MIL/uL (ref 3.87–5.11)
RDW: 13 % (ref 11.5–15.5)
WBC: 5.2 10*3/uL (ref 4.0–10.5)
nRBC: 0 % (ref 0.0–0.2)

## 2021-12-03 LAB — HEPATIC FUNCTION PANEL
ALT: 24 U/L (ref 0–44)
AST: 24 U/L (ref 15–41)
Albumin: 3.4 g/dL — ABNORMAL LOW (ref 3.5–5.0)
Alkaline Phosphatase: 71 U/L (ref 38–126)
Bilirubin, Direct: <0.1 mg/dL (ref 0.0–0.2)
Total Bilirubin: 0.5 mg/dL (ref 0.3–1.2)
Total Protein: 6.8 g/dL (ref 6.5–8.1)

## 2021-12-03 LAB — PREGNANCY, URINE: Preg Test, Ur: NEGATIVE

## 2021-12-03 LAB — D-DIMER, QUANTITATIVE: D-Dimer, Quant: 0.38 ug/mL-FEU (ref 0.00–0.50)

## 2021-12-03 LAB — TROPONIN I (HIGH SENSITIVITY)
Troponin I (High Sensitivity): 3 ng/L (ref <18)
Troponin I (High Sensitivity): 2 ng/L (ref <18)

## 2021-12-03 MED ORDER — ALUM & MAG HYDROXIDE-SIMETH 200-200-20 MG/5ML PO SUSP
30.0000 mL | Freq: Once | ORAL | Status: AC
  Administered 2021-12-03: 30 mL via ORAL

## 2021-12-03 MED ORDER — OMEPRAZOLE 20 MG PO CPDR: 20.0000 mg | DELAYED_RELEASE_CAPSULE | Freq: Every day | ORAL | 0 refills | Status: AC

## 2021-12-03 MED ORDER — ALUM & MAG HYDROXIDE-SIMETH 200-200-20 MG/5ML PO SUSP
ORAL | Status: AC
  Filled 2021-12-03: qty 30

## 2021-12-03 NOTE — ED Triage Notes (Signed)
C/o left arm and chest pain started around 6 pm while cooking;

## 2021-12-03 NOTE — ED Provider Notes (Signed)
[facility] Provider Note   CSN: [id number] Arrival date & time: 12/03/21  1902     History  Chief Complaint  Patient presents with   Chest Pain   Arm Pain    Valerie Whitehead is a 46 y.o. female.  Patient presents with episode of left-sided chest pain that occurred yesterday evening about 6 PM while she was cooking dinner.  The pain was in her left chest and radiated to her right arm.  It was fairly constant for about 6 hours until she went to bed around midnight after taking ibuprofen.  The pain was gone this morning when she woke up at about noon today has been constant since.  She describes pain to the left side of her chest that radiates down her left arm.  There is no associated shortness of breath, nausea, vomiting, cough, fever, diaphoresis, chills, dizziness or lightheadedness.  She has had this pain previously without clear diagnosis.  Denies any cardiac history.  She has never had a stress test. She has a history of hypertension, acid reflux disease, IBS and previous kidney cancer status postresection. Ibuprofen Seemed to help her pain.  Is not reproducible or pleuritic.  The history is provided by the patient.  Chest Pain Associated symptoms: shortness of breath   Associated symptoms: no abdominal pain, no nausea and no vomiting   Arm Pain Associated symptoms include chest pain and shortness of breath. Pertinent negatives include no abdominal pain.       Home Medications Prior to Admission medications   Medication Sig Start Date End Date Taking? Authorizing Provider  AMLODIPINE BENZOATE PO Take by mouth.    [provider]  buPROPion (WELLBUTRIN XL) 300 MG 24 hr tablet Take 300 mg by mouth daily.      [provider]  ciprofloxacin (CIPRO) 500 MG tablet Take 1 tablet (500 mg total) by mouth 2 (two) times daily. 01/31/16   Davonna Belling, MD  famotidine (PEPCID) 20 MG tablet Take 1 tablet (20 mg total) by mouth 2  (two) times daily. 10/08/18   Quintella Reichert, MD  hydrochlorothiazide (HYDRODIURIL) 12.5 MG tablet Take 12.5 mg by mouth daily.    [provider]  ibuprofen (ADVIL) 800 MG tablet Take 1 tablet (800 mg total) by mouth every 8 (eight) hours as needed. 10/08/18   Quintella Reichert, MD  iron polysaccharides (NIFEREX) 150 MG capsule Take by mouth. 09/15/17   [provider]  labetalol (NORMODYNE) 300 MG tablet Take 300 mg by mouth 2 (two) times daily. 06/28/15   [provider]  lisinopril (ZESTRIL) 20 MG tablet Take 20 mg by mouth daily.    [provider]  lisinopril-hydrochlorothiazide (ZESTORETIC) 10-12.5 MG tablet Take 1 tablet by mouth daily.    [provider]  losartan (COZAAR) 25 MG tablet Take 25 mg by mouth daily.    [provider]  methocarbamol (ROBAXIN) 500 MG tablet Take 1 tablet (500 mg total) by mouth every 8 (eight) hours as needed for muscle spasms. 10/17/21   Petrucelli, Glynda Jaeger, PA-C  methylPREDNISolone (MEDROL DOSEPAK) 4 MG TBPK tablet Take according to label instructions 10/08/18   Quintella Reichert, MD  metoprolol succinate (TOPROL XL) 25 MG 24 hr tablet Take 0.5 tablets (12.5 mg total) by mouth daily. 01/07/18   Little, Wenda Overland, MD  omeprazole (PRILOSEC) 20 MG capsule Take 20 mg by mouth daily.    [provider]  pantoprazole (PROTONIX) 40 MG tablet Take 40 mg by mouth  daily. 05/24/19   [provider]  sucralfate (CARAFATE) 1 g tablet Take 1 tablet (1 g total) by mouth 4 (four) times daily -  with meals and at bedtime. 06/02/19   Veryl Speak, MD  sucralfate (CARAFATE) 1 GM/10ML suspension Take 10 mLs (1 g total) by mouth 4 (four) times daily -  with meals and at bedtime. 06/10/20   Palumbo, April, MD      Allergies    Patient has no known allergies.    Review of Systems   Review of Systems  Constitutional:  Negative for activity change and appetite change.  Respiratory:  Positive for chest tightness and  shortness of breath.   Cardiovascular:  Positive for chest pain.  Gastrointestinal:  Negative for abdominal pain, nausea and vomiting.  Genitourinary:  Negative for dysuria and hematuria.  Musculoskeletal:  Negative for arthralgias and myalgias.   all other systems are negative except as noted in the HPI and PMH.    Physical Exam Updated Vital Signs BP 117/70 (BP Location: Right Arm)   Pulse 74   Temp 98.1 F (36.7 C) (Oral)   Resp (!) 21   Ht '5\' 8"'$  (1.727 m)   Wt 123.8 kg   LMP 11/27/2021   SpO2 100%   BMI 41.51 kg/m  Physical Exam Vitals and nursing note reviewed.  Constitutional:      General: She is not in acute distress.    Appearance: She is well-developed.  HENT:     Head: Normocephalic and atraumatic.     Mouth/Throat:     Pharynx: No oropharyngeal exudate.  Eyes:     Conjunctiva/sclera: Conjunctivae normal.     Pupils: Pupils are equal, round, and reactive to light.  Neck:     Comments: No meningismus. Cardiovascular:     Rate and Rhythm: Normal rate and regular rhythm.     Heart sounds: Normal heart sounds. No murmur heard. Pulmonary:     Effort: Pulmonary effort is normal. No respiratory distress.     Breath sounds: Normal breath sounds.  Chest:     Chest wall: No tenderness.  Abdominal:     Palpations: Abdomen is soft.     Tenderness: There is no abdominal tenderness. There is no guarding or rebound.  Musculoskeletal:        General: No tenderness. Normal range of motion.     Cervical back: Normal range of motion and neck supple.     Comments: Equal radial pulses and grip strengths.  Skin:    General: Skin is warm.  Neurological:     Mental Status: She is alert and oriented to person, place, and time.     Cranial Nerves: No cranial nerve deficit.     Motor: No abnormal muscle tone.     Coordination: Coordination normal.     Comments:  5/5 strength throughout. CN 2-12 intact.Equal grip strength.   Psychiatric:        Behavior: Behavior normal.      ED Results / Procedures / Treatments   Labs (all labs ordered are listed, but only abnormal results are displayed) Labs Reviewed  BASIC METABOLIC PANEL - Abnormal; Notable for the following components:      Result Value   Potassium 3.2 (*)    Glucose, Bld 120 (*)    Creatinine, Ser 1.20 (*)    Calcium 8.8 (*)    GFR, Estimated 58 (*)    All other components within normal limits  CBC - Abnormal; Notable for the  following components:   HCT 35.8 (*)    All other components within normal limits  HEPATIC FUNCTION PANEL - Abnormal; Notable for the following components:   Albumin 3.4 (*)    All other components within normal limits  PREGNANCY, URINE  D-DIMER, QUANTITATIVE  LIPASE, BLOOD  TROPONIN I (HIGH SENSITIVITY)  TROPONIN I (HIGH SENSITIVITY)    EKG EKG Interpretation  Date/Time:  Tuesday December 03 2021 19:20:08 EDT Ventricular Rate:  78 PR Interval:  170 QRS Duration: 100 QT Interval:  390 QTC Calculation: 444 R Axis:   92 Text Interpretation: Normal sinus rhythm Rightward axis Borderline ECG When compared with ECG of 09-Jun-2020 21:56, PREVIOUS ECG IS PRESENT No significant change was found Confirmed by Ezequiel Essex [phone]) on 12/03/2021 9:54:50 PM  Radiology DG Chest 2 View  Result Date: 12/03/2021 CLINICAL DATA:  chest pain EXAM: CHEST - 2 VIEW COMPARISON:  Chest x-ray April 05, 2021. FINDINGS: The heart size and mediastinal contours are within normal limits. Both lungs are clear. No visible pleural effusions or pneumothorax. No acute osseous abnormality. IMPRESSION: No active cardiopulmonary disease. Electronically Signed   By: Margaretha Sheffield M.D.   On: 12/03/2021 19:40    Procedures Procedures    Medications Ordered in ED Medications  alum & mag hydroxide-simeth (MAALOX/MYLANTA) 200-200-20 MG/5ML suspension 30 mL (has no administration in time range)    ED Course/ Medical Decision Making/ A&P                           Medical Decision  Making Amount and/or Complexity of Data Reviewed Labs: ordered. Decision-making details documented in ED Course. Radiology: ordered and independent interpretation performed. Decision-making details documented in ED Course. ECG/medicine tests: ordered and independent interpretation performed. Decision-making details documented in ED Course.  Risk OTC drugs. Prescription drug management.  Ongoing chest pain since 6 PM yesterday and since noon today.  EKG is sinus rhythm, no acute ST changes.  Pain is not reproducible.  Initial troponin is negative.  Chest x-ray is negative.  Patient reports ongoing chest pain since 6 PM last night until approximately midnight which then resolved but then started again today at noon.  Has had constant chest pressure since approximately noon.  EKG is sinus rhythm.  Pain is not reproducible.  Heart rate is 2.  Low suspicion for ACS.  Reassuring with negative troponins in the setting of multiple hours of ongoing pain.  May be GI or esophagus related.  We will refer for outpatient stress test.  Troponin negative x2.  Discussed with patient she is low risk for heart disease but nonzero risk. Low suspicion for pulmonary embolism or aortic dissection.  LFTs and lipase are normal.  D-dimer is negative.  Doubt pulmonary embolism.  Pain has improved.  Continue PPI.  Avoid alcohol, caffeine, NSAIDs, spicy foods  Will refer to cardiology for consideration of outpatient stress test.  Patient instructed to return to the ED with exertional chest pain, pain associate with shortness of breath, nausea, vomiting, sweating or other concerns.        Final Clinical Impression(s) / ED Diagnoses Final diagnoses:  Atypical chest pain    Rx / DC Orders ED Discharge Orders     None         Regis Wiland, Annie Main, MD 12/03/21 2326

## 2021-12-03 NOTE — Discharge Instructions (Signed)
There is no evidence of heart attack or blood clot in the lung.  As we discussed you are low risk for heart disease but nonzero risk.  Follow-up with your primary doctor and the cardiologist for a stress test.  Return to the ED with exertional chest pain, pain associate with shortness of breath, nausea, vomiting, sweating or other concerns.

## 2021-12-03 NOTE — ED Notes (Signed)
Pt placed on the monitor with a 5 lead, BP cuff and pulse ox

## 2021-12-18 DIAGNOSIS — R200 Anesthesia of skin: Secondary | ICD-10-CM

## 2021-12-18 HISTORY — DX: Anesthesia of skin: R20.0

## 2022-01-13 ENCOUNTER — Other Ambulatory Visit: Payer: Self-pay

## 2022-01-13 DIAGNOSIS — F32A Depression, unspecified: Secondary | ICD-10-CM | POA: Insufficient documentation

## 2022-01-13 DIAGNOSIS — K219 Gastro-esophageal reflux disease without esophagitis: Secondary | ICD-10-CM | POA: Insufficient documentation

## 2022-01-13 DIAGNOSIS — K589 Irritable bowel syndrome without diarrhea: Secondary | ICD-10-CM | POA: Insufficient documentation

## 2022-01-13 DIAGNOSIS — D649 Anemia, unspecified: Secondary | ICD-10-CM | POA: Insufficient documentation

## 2022-01-13 DIAGNOSIS — C801 Malignant (primary) neoplasm, unspecified: Secondary | ICD-10-CM | POA: Insufficient documentation

## 2022-01-13 DIAGNOSIS — K5730 Diverticulosis of large intestine without perforation or abscess without bleeding: Secondary | ICD-10-CM

## 2022-01-13 HISTORY — DX: Diverticulosis of large intestine without perforation or abscess without bleeding: K57.30

## 2022-01-22 ENCOUNTER — Ambulatory Visit: Payer: PRIVATE HEALTH INSURANCE | Admitting: Cardiology

## 2022-02-26 ENCOUNTER — Other Ambulatory Visit: Payer: Self-pay

## 2022-03-04 ENCOUNTER — Encounter: Payer: Self-pay | Admitting: Cardiology

## 2022-03-04 ENCOUNTER — Ambulatory Visit: Payer: PRIVATE HEALTH INSURANCE | Attending: Cardiology | Admitting: Cardiology

## 2022-03-04 VITALS — BP 116/70 | HR 93 | Ht 68.0 in | Wt 287.1 lb

## 2022-03-04 DIAGNOSIS — I10 Essential (primary) hypertension: Secondary | ICD-10-CM

## 2022-03-04 DIAGNOSIS — R079 Chest pain, unspecified: Secondary | ICD-10-CM | POA: Diagnosis not present

## 2022-03-04 DIAGNOSIS — E782 Mixed hyperlipidemia: Secondary | ICD-10-CM

## 2022-03-04 NOTE — Progress Notes (Signed)
Cardiology Office Note:    Date:  03/04/2022   ID:  Valerie Whitehead, DOB October 01, 1975, MRN [id number]  PCP:  [facility]  Cardiologist:  Jenean Lindau, MD   Referring MD: Ezequiel Essex, MD    ASSESSMENT:    1. Essential hypertension   2. Mixed hyperlipidemia   3. Morbid obesity ([place])   4. Chest pain of uncertain etiology    PLAN:    In order of problems listed above:  Primary prevention stressed with the patient.  Importance of compliance with diet medication stressed and she vocalized understanding. Chest pain: Atypical in nature.  However she is concerned about it.  In view of risk factors we will do an exercise stress echo.  If this is negative I told her to embark on an exercise program in a graded fashion. History of mixed dyslipidemia: I do not have the numbers.  This is managed by primary care.  Diet emphasized. Essential hypertension: Stable blood pressure at this time.  Diet, lifestyle modification urged.  She had questions which were answered to her satisfaction. Morbid obesity: Weight reduction stressed.  Diet emphasized.  Risks of obesity explained.  Lifestyle modification urged. Patient will be seen in follow-up appointment in 9 months or earlier if the patient has any concerns    Medication Adjustments/Labs and Tests Ordered: Current medicines are reviewed at length with the patient today.  Concerns regarding medicines are outlined above.  Orders Placed This Encounter  Procedures   EKG 12-Lead   ECHOCARDIOGRAM STRESS TEST   No orders of the defined types were placed in this encounter.    History of Present Illness:    Valerie Whitehead is a 47 y.o. female who is being seen today for the evaluation of chest pain at the request of Rancour, Annie Main, MD. patient is a pleasant 47 year old female.  She has past medical history of essential hypertension.  She has history of kidney cancer and has undergone nephrectomy.  She denies any history of  diabetes mellitus.  There is mention of hyperlipidemia.  No history of smoking.  She mentions to me that occasionally she will have chest pain not related to exertion no radiation to the neck or to the arms.  She is sexually active and sexual activity does not bring around any chest pain.  At the time of my evaluation, the patient is alert awake oriented and in no distress.  Past Medical History:  Diagnosis Date   Anemia    Bilateral carpal tunnel syndrome 09/14/2017   Cancer ([place])    kidney 2021   Depression    Post partum.     Diverticulosis of colon 01/13/2022   Essential hypertension 09/14/2017   Fibroid, uterine 09/14/2017   Formatting of this note might be different from the original. sees obgyn in [place] Formatting of this note might be different from the original. Overview: sees obgyn in [place]   GERD (gastroesophageal reflux disease)    Hyperlipidemia 02/12/2021   Formatting of this note might be different from the original. Low hdl Formatting of this note might be different from the original. Formatting of this note might be different from the original. Low hdl   Hypertension    IBS (irritable bowel syndrome)    Iron deficiency anemia 09/14/2017   Menorrhagia with regular cycle 09/02/2016   Mixed hyperlipidemia 09/14/2017   Formatting of this note might be different from the original. Overview: Low hdl   Numbness in feet 12/18/2021   Obesity  09/14/2017   Personal history of renal cell carcinoma 03/05/2020   Recurrent major depressive disorder, in partial remission ([place]) 09/14/2017   Stage 3a chronic kidney disease ([place]) 04/08/2021    Past Surgical History:  Procedure Laterality Date   CHOLECYSTECTOMY     PARTIAL NEPHRECTOMY Left     Current Medications: Current Meds  Medication Sig   buPROPion (WELLBUTRIN XL) 300 MG 24 hr tablet Take 300 mg by mouth daily.     ferrous sulfate 325 (65 FE) MG tablet Take 325 mg by mouth daily.   labetalol (NORMODYNE) 300 MG  tablet Take 300 mg by mouth 2 (two) times daily.   lisinopril-hydrochlorothiazide (ZESTORETIC) 10-12.5 MG tablet Take 1 tablet by mouth daily.   omeprazole (PRILOSEC) 20 MG capsule Take 1 capsule (20 mg total) by mouth daily.     Allergies:   Lisinopril and Nitrofurantoin   Social History   Socioeconomic History   Marital status: Single    Spouse name: Not on file   Number of children: 2   Years of education: Not on file   Highest education level: Not on file  Occupational History   Occupation: CNA  Tobacco Use   Smoking status: Never   Smokeless tobacco: Never  Vaping Use   Vaping Use: Not on file  Substance and Sexual Activity   Alcohol use: No   Drug use: No   Sexual activity: Not on file  Other Topics Concern   Not on file  Social History Narrative   Lives at home [place] and two children and husband.  Raising a 31 year old nephew.   Works two jobs.     Social Determinants of Health   Financial Resource Strain: Not on file  Food Insecurity: Not on file  Transportation Needs: Not on file  Physical Activity: Not on file  Stress: Not on file  Social Connections: Not on file     Family History: The patient's family history includes Dementia in her mother; Hypertension in her father; Peripheral vascular disease in her mother; Stroke in her father.  ROS:   Please see the history of present illness.    All other systems reviewed and are negative.  EKGs/Labs/Other Studies Reviewed:    The following studies were reviewed today: EKG reveals sinus rhythm and nonspecific ST-T changes   Recent Labs: 12/03/2021: ALT 24; BUN 15; Creatinine, Ser 1.20; Hemoglobin 12.0; Platelets 329; Potassium 3.2; Sodium 137  Recent Lipid Panel No results found for: "CHOL", "TRIG", "HDL", "CHOLHDL", "VLDL", "LDLCALC", "LDLDIRECT"  Physical Exam:    VS:  BP 116/70   Pulse 93   Ht '5\' 8"'$  (1.727 m)   Wt 287 lb 1.3 oz (130.2 kg)   SpO2 98%   BMI 43.65 kg/m     Wt Readings from Last 3  Encounters:  03/04/22 287 lb 1.3 oz (130.2 kg)  12/03/21 273 lb (123.8 kg)  10/17/21 273 lb (123.8 kg)     GEN: Patient is in no acute distress HEENT: Normal NECK: No JVD; No carotid bruits LYMPHATICS: No lymphadenopathy CARDIAC: S1 S2 regular, 2/6 systolic murmur at the apex. RESPIRATORY:  Clear to auscultation without rales, wheezing or rhonchi  ABDOMEN: Soft, non-tender, non-distended MUSCULOSKELETAL:  No edema; No deformity  SKIN: Warm and dry NEUROLOGIC:  Alert and oriented x 3 PSYCHIATRIC:  Normal affect    Signed, Jenean Lindau, MD  03/04/2022 2:44 PM    [place]

## 2022-03-04 NOTE — Patient Instructions (Addendum)
Medication Instructions:  Your physician recommends that you continue on your current medications as directed. Please refer to the Current Medication list given to you today.  *If you need a refill on your cardiac medications before your next appointment, please call your pharmacy*   Lab Work: None ordered If you have labs (blood work) drawn today and your tests are completely normal, you will receive your results only by: Valerie Whitehead (if you have MyChart) OR A paper copy in the mail If you have any lab test that is abnormal or we need to change your treatment, we will call you to review the results.   Testing/Procedures:      Stress Echocardiogram Information Sheet                                                      Instructions:    1. You may take your morning medications the morning of the test.  Hold Metoprolol and Labetalol.  2. Light breakfast.  3. Dress prepared to exercise.  4. DO NOT use ANY caffeine or tobacco products 3 hours before appointment.  5. Please bring all current prescription medications.    Follow-Up: At [facility], you and your health needs are our priority.  As part of our continuing mission to provide you with exceptional heart care, we have created designated Provider Care Teams.  These Care Teams include your primary Cardiologist (physician) and Advanced Practice Providers (APPs -  Physician Assistants and Nurse Practitioners) who all work together to provide you with the care you need, when you need it.  We recommend signing up for the patient portal called "MyChart".  Sign up information is provided on this After Visit Summary.  MyChart is used to connect with patients for Virtual Visits (Telemedicine).  Patients are able to view lab/test results, encounter notes, upcoming appointments, etc.  Non-urgent messages can be sent to your provider as well.   To learn more about what you can do with MyChart, go to [email or web address].     Your next appointment:   9 month(s)  The format for your next appointment:   In Person  Provider:   Jyl Heinz, MD   Other Instructions Exercise Stress Echocardiogram An exercise stress echocardiogram is a test to check how well your heart is working. This test uses sound waves and a computer to make pictures of your heart. These pictures will be taken before and after you exercise. For this test, you will walk on a treadmill or ride a bicycle to make your heart beat faster. While you exercise, your heart will be checked with an electrocardiogram (ECG). Your blood pressure will also be checked. You may have this test if: You have chest pain or a heart problem. You had a heart attack or heart surgery not long ago. You have heart valve problems. You have a condition that causes narrowing of the blood vessels that supply your heart. You have a high risk of heart disease and: You are starting a new exercise program. You need to have a big surgery. Tell a doctor about: Any allergies you have. All medicines you are taking. This includes vitamins, herbs, eye drops, creams, and over-the-counter medicines. Any problems you or family members have had with medicines that make you fall asleep (anesthetic medicines). Any surgeries you have had. Any blood  disorders you have. Any medical conditions you have. Whether you are pregnant or may be pregnant. What are the risks? Generally, this is a safe test. However, problems may occur, including: Chest pain. Feeling dizzy or light-headed. Shortness of breath. Increased or irregular heartbeat. Feeling like you may vomit (nausea) or vomiting. Heart attack. This is very rare. What happens before the test? Medicines Ask your doctor about changing or stopping your normal medicines. This is important if you take diabetes medicines or blood thinners. If you use an inhaler, bring it to the test. General instructions Wear comfortable clothes  and walking shoes. Follow instructions from your doctor about what you cannot eat or drink before the test. Do not drink or eat anything that has caffeine in it. Stop having caffeine 24 hours before the test. Do not smoke or use products that contain nicotine or tobacco for 4 hours before the test. If you need help quitting, ask your doctor. What happens during the test?  You will take off your clothes from the waist up and put on a hospital gown. Electrodes or patches will be put on your chest. A blood pressure cuff will be put on your arm. Before you exercise, a computer will make a picture of your heart. To do this: You will lie down and a gel will be put on your chest. A wand will be moved over the gel. Sound waves from the wand will go to the computer to make the picture. Then, you will start to exercise. You may walk on a treadmill or pedal a bicycle. Your blood pressure and heart rhythm will be checked while you exercise. The exercise will get harder or faster. You will exercise until: Your heart reaches a certain level. You are too tired to go on. You cannot go on because of chest pain, weakness, or dizziness. You will lie down right away so another picture of your heart can be taken. The procedure may vary among doctors and hospitals. What can I expect after the test? After your test, it is common to have: Mild soreness. Mild tiredness. Your heart rate and blood pressure will be checked until they return to your normal levels. You should not have any new symptoms after this test. Follow these instructions at home: If your doctor says that you can, you may: Eat what you normally eat. Do your normal activities. Take over-the-counter and prescription medicines only as told by your doctor. Keep all follow-up visits. It is up to you to get the results of your test. Ask how to get your results when they are ready. Contact a doctor if: You feel dizzy or light-headed. You have a  fast or irregular heartbeat. You feel like you may vomit or you vomit. You have a headache. You feel short of breath. Get help right away if: You develop pain or pressure: In your chest. In your jaw or neck. Between your shoulders. That goes down your left arm. You faint. You have trouble breathing. These symptoms may be an emergency. Get medical help right away. Call your local emergency services (911 in the [place] Do not wait to see if the symptoms will go away. Do not drive yourself to the hospital. Summary This is a test that checks how well your heart is working. Follow instructions about what you cannot eat or drink before the test. Ask your doctor if you should take your normal medicines before the test. Stop having caffeine 24 hours before the test. Do not smoke  or use products with nicotine or tobacco in them for 4 hours before the test. During the test, your blood pressure and heart rhythm will be checked while you exercise. This information is not intended to replace advice given to you by your health care provider. Make sure you discuss any questions you have with your health care provider. Document Revised: 10/10/2020 Document Reviewed: 09/20/2019 Elsevier Patient Education  2022 [employer].

## 2022-03-18 ENCOUNTER — Other Ambulatory Visit (HOSPITAL_COMMUNITY): Payer: PRIVATE HEALTH INSURANCE

## 2022-03-20 ENCOUNTER — Telehealth (HOSPITAL_COMMUNITY): Payer: Self-pay

## 2022-03-20 NOTE — Telephone Encounter (Signed)
Attempted to contact the patient without success. Will try again later. S.Welby Montminy EMTP/CCT

## 2022-03-25 ENCOUNTER — Other Ambulatory Visit (HOSPITAL_COMMUNITY): Payer: PRIVATE HEALTH INSURANCE

## 2022-04-17 ENCOUNTER — Telehealth (HOSPITAL_COMMUNITY): Payer: Self-pay | Admitting: *Deleted

## 2022-04-17 NOTE — Telephone Encounter (Signed)
Left message on voicemail per DPR in reference to upcoming appointment scheduled on 04/22/2022 at 2:30 with detailed instructions given per Stress Test Requisition Sheet for the test. LM to arrive 30 minutes early, and that it is imperative to arrive on time for appointment to keep from having the test rescheduled. If you need to cancel or reschedule your appointment, please call the office within 24 hours of your appointment. Failure to do so may result in a cancellation of your appointment, and a $50 no show fee. Phone number given for call back for any questions. [name]

## 2022-04-22 ENCOUNTER — Other Ambulatory Visit (HOSPITAL_COMMUNITY): Payer: PRIVATE HEALTH INSURANCE

## 2022-04-28 ENCOUNTER — Encounter (HOSPITAL_COMMUNITY): Payer: Self-pay | Admitting: Cardiology

## 2022-05-12 ENCOUNTER — Telehealth (HOSPITAL_COMMUNITY): Payer: Self-pay | Admitting: Cardiology

## 2022-05-12 NOTE — Telephone Encounter (Signed)
Just an FYI. We have made several attempts to contact this patient including sending a letter to schedule or reschedule their stress echocardiogram. We will be removing the patient from the echo [place].   04/28/22 SENT LETTER THRU MY CHART 04/23/22 [facility] to schedule @ 10:44/LBW  04/22/22 pt cancelled due to she has COVID- cancelled with [facility] same day hours before appt.      Thank you

## 2022-06-10 ENCOUNTER — Telehealth: Payer: Self-pay

## 2022-06-10 NOTE — Telephone Encounter (Signed)
Spoke with pt who states that she had only made 2 appointments and the two she made she cancelled. One of the appointments cancelled the pt states she had COVID. Pt states the third appointment she did not schedule. Please reschedule.

## 2022-06-10 NOTE — Telephone Encounter (Signed)
-----   Message from Garwin Brothers, MD sent at 06/03/2022 12:23 PM EDT ----- Please talk to her and let her know this is the last time we will schedule this. ----- Message ----- From: Minette Brine Sent: 06/03/2022  11:18 AM EDT To: Garwin Brothers, MD  Patient has no showed and cancelled multiple appts for a stress echocardiogram.  She is now calling to reschedule. Do you need to see her first..

## 2022-09-22 ENCOUNTER — Other Ambulatory Visit: Payer: Self-pay

## 2022-09-22 ENCOUNTER — Encounter (HOSPITAL_BASED_OUTPATIENT_CLINIC_OR_DEPARTMENT_OTHER): Payer: Self-pay | Admitting: Emergency Medicine

## 2022-09-22 ENCOUNTER — Emergency Department (HOSPITAL_BASED_OUTPATIENT_CLINIC_OR_DEPARTMENT_OTHER): Payer: PRIVATE HEALTH INSURANCE

## 2022-09-22 ENCOUNTER — Emergency Department (HOSPITAL_BASED_OUTPATIENT_CLINIC_OR_DEPARTMENT_OTHER)
Admission: EM | Admit: 2022-09-22 | Discharge: 2022-09-22 | Disposition: A | Discharge status: Home / Self Care | Payer: PRIVATE HEALTH INSURANCE

## 2022-09-22 DIAGNOSIS — M25512 Pain in left shoulder: Secondary | ICD-10-CM | POA: Diagnosis present

## 2022-09-22 DIAGNOSIS — M79622 Pain in left upper arm: Secondary | ICD-10-CM | POA: Insufficient documentation

## 2022-09-22 DIAGNOSIS — W010XXA Fall on same level from slipping, tripping and stumbling without subsequent striking against object, initial encounter: Secondary | ICD-10-CM | POA: Insufficient documentation

## 2022-09-22 DIAGNOSIS — W19XXXA Unspecified fall, initial encounter: Secondary | ICD-10-CM

## 2022-09-22 DIAGNOSIS — M79602 Pain in left arm: Secondary | ICD-10-CM

## 2022-09-22 MED ORDER — KETOROLAC TROMETHAMINE 15 MG/ML IJ SOLN
15.0000 mg | Freq: Once | INTRAMUSCULAR | Status: AC
  Administered 2022-09-22: 15 mg via INTRAMUSCULAR
  Filled 2022-09-22: qty 1

## 2022-09-22 MED ORDER — METHOCARBAMOL 500 MG PO TABS: 1000.0000 mg | ORAL_TABLET | Freq: Four times a day (QID) | ORAL | 0 refills | Status: AC

## 2022-09-22 NOTE — Discharge Instructions (Signed)
Please read and follow all provided instructions.  Your diagnoses today include:  1. Pain of left upper extremity   2. Fall, initial encounter     Tests performed today include: An x-ray of the affected area - does NOT show any broken bones Vital signs. See below for your results today.   Medications prescribed:  Robaxin (methocarbamol) - muscle relaxer medication  DO NOT drive or perform any activities that require you to be awake and alert because this medicine can make you drowsy.   Take any prescribed medications only as directed.  Home care instructions:  Follow any educational materials contained in this packet Follow R.I.C.E. Protocol: R - rest your injury  I  - use ice on injury without applying directly to skin C - compress injury with bandage or splint E - elevate the injury as much as possible  Follow-up instructions: Please follow-up with your primary care provider if you continue to have significant pain in 1 week. In this case you may have a more severe injury that requires further care.   Return instructions:  Please return if your fingers are numb or tingling, appear gray or blue, or you have severe pain (also elevate the arm and loosen splint or wrap if you were given one) Please return to the Emergency Department if you experience worsening symptoms.  Please return if you have any other emergent concerns.  Additional Information:  Your vital signs today were: BP 119/78   Pulse 82   Temp 98 F (36.7 C) (Oral)   Resp 18   Wt 122.9 kg   LMP 09/12/2022 (Approximate)   SpO2 99%   BMI 41.21 kg/m  If your blood pressure (BP) was elevated above 135/85 this visit, please have this repeated by your doctor within one month. --------------

## 2022-09-22 NOTE — ED Triage Notes (Signed)
Left upper arm pain post fall yesterday , no head injury .

## 2022-09-22 NOTE — ED Provider Notes (Signed)
EMERGENCY DEPARTMENT AT [facility] Provider Note   CSN: [id number] Arrival date & time: 09/22/22  0825     History  Chief Complaint  Patient presents with   Arm Injury    left    Valerie Whitehead is a 47 y.o. female.  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  Patient states that she has been exercising lifting weights recently.  Yesterday she was getting into a bathtub for muscle soreness and slipped, causing her to fall onto her left shoulder.  She has had pain in the shoulder and the origin of the triceps since that time.  She takes Tylenol for pain.  Her creatinine is mildly elevated at baseline.  No distal numbness or tingling.  No head injury or neck pain.  Pain is worse with movement and palpation.       Home Medications Prior to Admission medications   Medication Sig Start Date End Date Taking? Authorizing Provider  methocarbamol (ROBAXIN) 500 MG tablet Take 2 tablets (1,000 mg total) by mouth 4 (four) times daily. 09/22/22  Yes Renne Crigler, PA-C  buPROPion (WELLBUTRIN XL) 300 MG 24 hr tablet Take 300 mg by mouth daily.      [provider]  ferrous sulfate 325 (65 FE) MG tablet Take 325 mg by mouth daily. 12/17/21   [provider]  labetalol (NORMODYNE) 300 MG tablet Take 300 mg by mouth 2 (two) times daily. 06/28/15   [provider]  lisinopril-hydrochlorothiazide (ZESTORETIC) 10-12.5 MG tablet Take 1 tablet by mouth daily.    [provider]  omeprazole (PRILOSEC) 20 MG capsule Take 1 capsule (20 mg total) by mouth daily. 12/03/21   Rancour, Jeannett Senior, MD      Allergies    Lisinopril and Nitrofurantoin    Review of Systems   Review of Systems  Physical Exam Updated Vital Signs BP 119/78   Pulse 82   Temp 98 F (36.7 C) (Oral)   Resp 18   Wt 122.9 kg   LMP 09/12/2022 (Approximate)   SpO2  99%   BMI 41.21 kg/m  Physical Exam Vitals and nursing note reviewed.  Constitutional:      Appearance: She is well-developed.  HENT:     Head: Normocephalic and atraumatic.  Eyes:     Pupils: Pupils are equal, round, and reactive to light.  Cardiovascular:     Pulses: Normal pulses. No decreased pulses.  Musculoskeletal:        General: Tenderness present.     Left shoulder: Tenderness (Posterior) present. No bony tenderness. Normal range of motion.     Left upper arm: Tenderness present. No deformity or bony tenderness.     Left elbow: Normal range of motion. No tenderness.     Left forearm: No tenderness.     Left wrist: No bony tenderness. Normal range of motion.     Cervical back: Normal range of motion and neck supple. No tenderness. Normal range of motion.     Thoracic back: No tenderness. Normal range of motion.  Skin:    General: Skin is warm and dry.  Neurological:     Mental Status: She is alert.     Sensory: No sensory deficit.     Comments: Motor, sensation, and vascular distal to the injury is fully intact.   Psychiatric:        Mood and Affect: Mood  normal.     ED Results / Procedures / Treatments   Labs (all labs ordered are listed, but only abnormal results are displayed) Labs Reviewed - No data to display  EKG None  Radiology DG Humerus Left  Result Date: 09/22/2022 CLINICAL DATA:  Injury, fall EXAM: LEFT HUMERUS - 2+ VIEW COMPARISON:  03/12/2022 FINDINGS: There is no evidence of fracture or other focal bone lesions. Soft tissues are unremarkable. IMPRESSION: Negative. Electronically Signed   By: Judie Petit.  Shick M.D.   On: 09/22/2022 08:53    Procedures Procedures    Medications Ordered in ED Medications  ketorolac (TORADOL) 15 MG/ML injection 15 mg (has no administration in time range)    ED Course/ Medical Decision Making/ A&P    Patient seen and examined. History obtained directly from patient.  I reviewed previous lab work in  Academic librarian.  Labs/EKG: None ordered Imaging: Independently reviewed and interpreted.  This included: X-ray of the left humerus, agree no fracture or dislocation noted.  Medications/Fluids: Ordered: IM Toradol 15 mg x1.  Discussed risks and benefits of NSAIDs with her history of solitary kidney.  She agrees to one-time dose.  Most recent vital signs reviewed and are as follows: BP 119/78   Pulse 82   Temp 98 F (36.7 C) (Oral)   Resp 18   Wt 122.9 kg   LMP 09/12/2022 (Approximate)   SpO2 99%   BMI 41.21 kg/m   Initial impression: Shoulder sprain/contusion, triceps strain  Home treatment plan: RICE protocol, sling (will provide), Robaxin  Return instructions discussed with patient: New or worsening symptoms  Follow-up instructions discussed with patient: PCP in 1 week if not improving                                Medical Decision Making Amount and/or Complexity of Data Reviewed Radiology: ordered.  Risk Prescription drug management.   Patient with fall.  She has pain in the left posterior shoulder and origin of the triceps area with negative x-ray.  No distal numbness or tingling.  Routine care indicated at this point.        Final Clinical Impression(s) / ED Diagnoses Final diagnoses:  Pain of left upper extremity  Fall, initial encounter    Rx / DC Orders ED Discharge Orders          Ordered    methocarbamol (ROBAXIN) 500 MG tablet  4 times daily        09/22/22 0944              Renne Crigler, PA-C 09/22/22 0948    Coral Spikes, DO 09/22/22 1519

## 2023-05-16 ENCOUNTER — Other Ambulatory Visit: Payer: Self-pay

## 2023-05-16 ENCOUNTER — Emergency Department (HOSPITAL_BASED_OUTPATIENT_CLINIC_OR_DEPARTMENT_OTHER)
Admission: EM | Admit: 2023-05-16 | Discharge: 2023-05-16 | Disposition: A | Discharge status: Home / Self Care | Payer: Self-pay | Attending: Emergency Medicine | Admitting: Emergency Medicine

## 2023-05-16 ENCOUNTER — Encounter (HOSPITAL_BASED_OUTPATIENT_CLINIC_OR_DEPARTMENT_OTHER): Payer: Self-pay | Admitting: Emergency Medicine

## 2023-05-16 DIAGNOSIS — M79669 Pain in unspecified lower leg: Secondary | ICD-10-CM | POA: Insufficient documentation

## 2023-05-16 DIAGNOSIS — M79605 Pain in left leg: Secondary | ICD-10-CM | POA: Insufficient documentation

## 2023-05-16 DIAGNOSIS — Z79899 Other long term (current) drug therapy: Secondary | ICD-10-CM | POA: Insufficient documentation

## 2023-05-16 DIAGNOSIS — M79604 Pain in right leg: Secondary | ICD-10-CM | POA: Diagnosis present

## 2023-05-16 DIAGNOSIS — M25561 Pain in right knee: Secondary | ICD-10-CM

## 2023-05-16 DIAGNOSIS — M25562 Pain in left knee: Secondary | ICD-10-CM

## 2023-05-16 MED ORDER — TRAMADOL HCL 50 MG PO TABS: 50.0000 mg | ORAL_TABLET | Freq: Four times a day (QID) | ORAL | 0 refills | Status: AC | PRN

## 2023-05-16 NOTE — ED Provider Notes (Signed)
 [place] [facility] Provider Note   CSN: [id number] Arrival date & time: 05/16/23  2002     History  Chief Complaint  Patient presents with   Leg Pain    Valerie Whitehead is a 48 y.o. female.  Patient to ED with bilateral knee and lower leg pain. She reports 'years' of bilateral lower leg discomfort but the knees became painful yesterday. No fall or injury. No history of clot. She reports she has been going to the gym and doing the stair-master frequently which may be contributing. No fever.   The history is provided by the patient. No language interpreter was used.  Leg Pain      Home Medications Prior to Admission medications   Medication Sig Start Date End Date Taking? Authorizing Provider  traMADol (ULTRAM) 50 MG tablet Take 1 tablet (50 mg total) by mouth every 6 (six) hours as needed. 05/16/23  Yes Castiel Lauricella, Melvenia Beam, PA-C  buPROPion (WELLBUTRIN XL) 300 MG 24 hr tablet Take 300 mg by mouth daily.      [provider]  ferrous sulfate 325 (65 FE) MG tablet Take 325 mg by mouth daily. 12/17/21   [provider]  labetalol (NORMODYNE) 300 MG tablet Take 300 mg by mouth 2 (two) times daily. 06/28/15   [provider]  lisinopril-hydrochlorothiazide (ZESTORETIC) 10-12.5 MG tablet Take 1 tablet by mouth daily.    [provider]  methocarbamol (ROBAXIN) 500 MG tablet Take 2 tablets (1,000 mg total) by mouth 4 (four) times daily. 09/22/22   Renne Crigler, PA-C  omeprazole (PRILOSEC) 20 MG capsule Take 1 capsule (20 mg total) by mouth daily. 12/03/21   Rancour, Jeannett Senior, MD      Allergies    Lisinopril and Nitrofurantoin    Review of Systems   Review of Systems  Physical Exam Updated Vital Signs BP 126/72   Pulse 94   Temp 99.4 F (37.4 C)   Resp 18   Ht 5\' 8"  (1.727 m)   Wt 102.5 kg   LMP 05/06/2023   SpO2 98%   BMI 34.36 kg/m  Physical Exam Constitutional:      General: She is not in acute  distress.    Appearance: She is well-developed. She is not ill-appearing.  Pulmonary:     Effort: Pulmonary effort is normal.  Musculoskeletal:        General: Normal range of motion.     Cervical back: Normal range of motion.     Comments: There is no swelling, redness or deformity of the lower extremities. Knees are not red or warm. There is no pitting. Distal pulses are intact. Thighs are nontender.   Skin:    General: Skin is warm and dry.  Neurological:     Mental Status: She is alert and oriented to person, place, and time.     ED Results / Procedures / Treatments   Labs (all labs ordered are listed, but only abnormal results are displayed) Labs Reviewed - No data to display  EKG None  Radiology No results found.  Procedures Procedures    Medications Ordered in ED Medications - No data to display  ED Course/ Medical Decision Making/ A&P Clinical Course as of 05/16/23 2122  Sat May 16, 2023  2118 Patient with bilateral LE pain, chronic below the knees and acute pain in both knees. No evidence of infection, no injury, no vascular compromise, normal neuro exam. Discussed pain management. She cannot take ibuprofen with history  of RCC and left nephrectomy. Database shows no previous opioid use. Will provide tramadol for pain, continue tylenol, rest for the next week from going to the gym. Close PCP follow up if symptoms persist.  [SU]    Clinical Course User Index [SU] Elpidio Anis, PA-C                                 Medical Decision Making          Final Clinical Impression(s) / ED Diagnoses Final diagnoses:  Acute pain of both knees    Rx / DC Orders ED Discharge Orders          Ordered    traMADol (ULTRAM) 50 MG tablet  Every 6 hours PRN        05/16/23 2121              Elpidio Anis, PA-C 05/16/23 2122    Benjiman Core, MD 05/17/23 [id number]

## 2023-05-16 NOTE — ED Triage Notes (Signed)
 Pt c/o BLE pain from knees down for a while, worse today

## 2023-05-16 NOTE — Discharge Instructions (Addendum)
 Rest the knees for the next week by taking a break from the gym. Continue Tylenol regularly and take Tramadol as needed for more significant pain.   See your doctor next week for recheck.

## 2023-05-18 ENCOUNTER — Other Ambulatory Visit: Payer: Self-pay

## 2023-05-18 ENCOUNTER — Emergency Department (HOSPITAL_BASED_OUTPATIENT_CLINIC_OR_DEPARTMENT_OTHER)
Admission: EM | Admit: 2023-05-18 | Discharge: 2023-05-18 | Disposition: A | Discharge status: Home / Self Care | Attending: Emergency Medicine | Admitting: Emergency Medicine

## 2023-05-18 ENCOUNTER — Encounter (HOSPITAL_BASED_OUTPATIENT_CLINIC_OR_DEPARTMENT_OTHER): Payer: Self-pay

## 2023-05-18 DIAGNOSIS — R300 Dysuria: Secondary | ICD-10-CM | POA: Insufficient documentation

## 2023-05-18 DIAGNOSIS — N1831 Chronic kidney disease, stage 3a: Secondary | ICD-10-CM | POA: Diagnosis not present

## 2023-05-18 DIAGNOSIS — Z85528 Personal history of other malignant neoplasm of kidney: Secondary | ICD-10-CM | POA: Diagnosis not present

## 2023-05-18 DIAGNOSIS — I129 Hypertensive chronic kidney disease with stage 1 through stage 4 chronic kidney disease, or unspecified chronic kidney disease: Secondary | ICD-10-CM | POA: Diagnosis not present

## 2023-05-18 DIAGNOSIS — M791 Myalgia, unspecified site: Secondary | ICD-10-CM | POA: Diagnosis not present

## 2023-05-18 DIAGNOSIS — E860 Dehydration: Secondary | ICD-10-CM | POA: Diagnosis not present

## 2023-05-18 LAB — BASIC METABOLIC PANEL WITH GFR
Anion gap: 8 (ref 5–15)
BUN: 15 mg/dL (ref 6–20)
CO2: 28 mmol/L (ref 22–32)
Calcium: 8.5 mg/dL — ABNORMAL LOW (ref 8.9–10.3)
Chloride: 98 mmol/L (ref 98–111)
Creatinine, Ser: 1.28 mg/dL — ABNORMAL HIGH (ref 0.44–1.00)
GFR, Estimated: 53 mL/min — ABNORMAL LOW (ref >60)
Glucose, Bld: 111 mg/dL — ABNORMAL HIGH (ref 70–99)
Potassium: 3.5 mmol/L (ref 3.5–5.1)
Sodium: 134 mmol/L — ABNORMAL LOW (ref 135–145)

## 2023-05-18 LAB — URINALYSIS, ROUTINE W REFLEX MICROSCOPIC
Bilirubin Urine: NEGATIVE
Glucose, UA: NEGATIVE mg/dL
Ketones, ur: NEGATIVE mg/dL
Leukocytes,Ua: NEGATIVE
Nitrite: NEGATIVE
Protein, ur: 30 mg/dL — AB
Specific Gravity, Urine: 1.025 (ref 1.005–1.030)
pH: 5.5 (ref 5.0–8.0)

## 2023-05-18 LAB — CK: Total CK: 382 U/L — ABNORMAL HIGH (ref 38–234)

## 2023-05-18 LAB — PREGNANCY, URINE: Preg Test, Ur: NEGATIVE

## 2023-05-18 LAB — URINALYSIS, MICROSCOPIC (REFLEX)

## 2023-05-18 MED ORDER — SODIUM CHLORIDE 0.9 % IV BOLUS
500.0000 mL | Freq: Once | INTRAVENOUS | Status: AC
  Administered 2023-05-18: 500 mL via INTRAVENOUS

## 2023-05-18 NOTE — Discharge Instructions (Signed)
 Please drink plenty of fluids.  Your urine test here did not show obvious infection but we will send it for culture and call you if you need to start antibiotics.  If you feel worse such as developing chest pain, inability to urinate, abdominal discomfort please return to the emergency department for reevaluation.

## 2023-05-18 NOTE — ED Triage Notes (Signed)
 C/o burning with urination, urinary urgency. Denies abdominal/flank pain.

## 2023-05-18 NOTE — ED Provider Notes (Signed)
 Emergency Department Provider Note   I have reviewed the triage vital signs and the nursing notes.   HISTORY  Chief Complaint Dysuria   HPI Valerie Whitehead is a 48 y.o. female past history of hypertension, hyperlipidemia, IBS presents to the emergency department for evaluation of burning with urination, muscle cramping, concern for dehydration.  She has been not good about drinking fluids and notes a prior history of kidney tumor with partial nephrectomy.  You have been having some muscle cramping pain along with the burning urination.  No vaginal bleeding or discharge.  No pain into the abdomen or flanks.  Past Medical History:  Diagnosis Date   Anemia    Bilateral carpal tunnel syndrome 09/14/2017   Cancer (HCC)    kidney 2021   Depression    Post partum.     Diverticulosis of colon 01/13/2022   Essential hypertension 09/14/2017   Fibroid, uterine 09/14/2017   Formatting of this note might be different from the original. sees obgyn in [place] Formatting of this note might be different from the original. Overview: sees obgyn in [place]   GERD (gastroesophageal reflux disease)    Hyperlipidemia 02/12/2021   Formatting of this note might be different from the original. Low hdl Formatting of this note might be different from the original. Formatting of this note might be different from the original. Low hdl   Hypertension    IBS (irritable bowel syndrome)    Iron deficiency anemia 09/14/2017   Menorrhagia with regular cycle 09/02/2016   Mixed hyperlipidemia 09/14/2017   Formatting of this note might be different from the original. Overview: Low hdl   Numbness in feet 12/18/2021   Obesity 09/14/2017   Personal history of renal cell carcinoma 03/05/2020   Recurrent major depressive disorder, in partial remission (HCC) 09/14/2017   Stage 3a chronic kidney disease (HCC) 04/08/2021    Review of Systems  Constitutional: No fever/chills Cardiovascular: Denies chest  pain. Respiratory: Denies shortness of breath. Gastrointestinal: No abdominal pain. No vomiting or diarrhea.  Musculoskeletal: Negative for back pain. Positive muscle cramping pain.  Skin: Negative for rash. Neurological: Negative for headaches.   ____________________________________________   PHYSICAL EXAM:  VITAL SIGNS: ED Triage Vitals  Encounter Vitals Group     BP 05/18/23 1054 119/81     Pulse Rate 05/18/23 1054 90     Resp 05/18/23 1054 18     Temp 05/18/23 1052 98.5 F (36.9 C)     Temp src --      SpO2 05/18/23 1054 99 %     Weight 05/18/23 1053 226 lb (102.5 kg)     Height 05/18/23 1053 5\' 8"  (1.727 m)   Constitutional: Alert and oriented. Well appearing and in no acute distress. Eyes: Conjunctivae are normal.  Head: Atraumatic. Nose: No congestion/rhinnorhea. Mouth/Throat: Mucous membranes are moist. Neck: No stridor.   Cardiovascular: Normal rate, regular rhythm. Good peripheral circulation. Grossly normal heart sounds.   Respiratory: Normal respiratory effort.  No retractions. Lungs CTAB. Gastrointestinal:No distention.  Musculoskeletal: No gross deformities of extremities. Neurologic:  Normal speech and language.  Skin:  Skin is warm, dry and intact. No rash noted.  ____________________________________________   LABS (all labs ordered are listed, but only abnormal results are displayed)  Labs Reviewed  URINALYSIS, ROUTINE W REFLEX MICROSCOPIC - Abnormal; Notable for the following components:      Result Value   APPearance HAZY (*)    Hgb urine dipstick TRACE (*)    Protein, ur  30 (*)    All other components within normal limits  URINALYSIS, MICROSCOPIC (REFLEX) - Abnormal; Notable for the following components:   Bacteria, UA RARE (*)    All other components within normal limits  PREGNANCY, URINE  BASIC METABOLIC PANEL WITH GFR  CK   ____________________________________________   PROCEDURES  Procedure(s) performed:    Procedures   ____________________________________________   INITIAL IMPRESSION / ASSESSMENT AND PLAN / ED COURSE  Pertinent labs & imaging results that were available during my care of the patient were reviewed by me and considered in my medical decision making (see chart for details).   This patient is Presenting for Evaluation of muscle cramping pain, which does require a range of treatment options, and is a complaint that involves a moderate risk of morbidity and mortality.  The Differential Diagnoses include AKI, rhabdomyolysis, UTI, etc.  Critical Interventions-    Medications  sodium chloride 0.9 % bolus 500 mL (500 mLs Intravenous New Bag/Given 05/18/23 1127)    Reassessment after intervention: symptoms improved.    Clinical Laboratory Tests Ordered, included ***  Medical Decision Making: Summary:  The patient presents emergency department with UTI symptoms but also concern for dehydration with muscle cramping.  History of partial nephrectomy.  Plan for screening chemistry, CK, IV fluids and reassess.  UA not consistent with UTI.   Reevaluation with update and discussion with   ***Considered admission***  Patient's presentation is most consistent with acute, uncomplicated illness.   Disposition:   ____________________________________________  FINAL CLINICAL IMPRESSION(S) / ED DIAGNOSES  Final diagnoses:  None     NEW OUTPATIENT MEDICATIONS STARTED DURING THIS VISIT:  New Prescriptions   No medications on file    Note:  This document was prepared using Dragon voice recognition software and may include unintentional dictation errors.  Alona Bene, MD, [facility]

## 2023-05-19 LAB — URINE CULTURE: Culture: NO GROWTH

## 2023-09-01 ENCOUNTER — Emergency Department (HOSPITAL_BASED_OUTPATIENT_CLINIC_OR_DEPARTMENT_OTHER)
Admission: EM | Admit: 2023-09-01 | Discharge: 2023-09-01 | Disposition: A | Discharge status: Home / Self Care | Attending: Emergency Medicine | Admitting: Emergency Medicine

## 2023-09-01 ENCOUNTER — Other Ambulatory Visit: Payer: Self-pay

## 2023-09-01 ENCOUNTER — Encounter (HOSPITAL_BASED_OUTPATIENT_CLINIC_OR_DEPARTMENT_OTHER): Payer: Self-pay | Admitting: Radiology

## 2023-09-01 DIAGNOSIS — Z85528 Personal history of other malignant neoplasm of kidney: Secondary | ICD-10-CM | POA: Insufficient documentation

## 2023-09-01 DIAGNOSIS — N1831 Chronic kidney disease, stage 3a: Secondary | ICD-10-CM | POA: Diagnosis not present

## 2023-09-01 DIAGNOSIS — I129 Hypertensive chronic kidney disease with stage 1 through stage 4 chronic kidney disease, or unspecified chronic kidney disease: Secondary | ICD-10-CM | POA: Diagnosis not present

## 2023-09-01 DIAGNOSIS — G5702 Lesion of sciatic nerve, left lower limb: Secondary | ICD-10-CM | POA: Insufficient documentation

## 2023-09-01 DIAGNOSIS — M79605 Pain in left leg: Secondary | ICD-10-CM | POA: Diagnosis present

## 2023-09-01 MED ORDER — PREDNISONE 10 MG PO TABS: ORAL_TABLET | ORAL | 0 refills | Status: AC

## 2023-09-01 MED ORDER — LIDOCAINE 5 % EX PTCH
1.0000 | MEDICATED_PATCH | CUTANEOUS | 0 refills | Status: AC
Start: 2023-09-01 — End: ?

## 2023-09-01 MED ORDER — OXYCODONE-ACETAMINOPHEN 5-325 MG PO TABS: 1.0000 | ORAL_TABLET | Freq: Four times a day (QID) | ORAL | 0 refills | Status: AC | PRN

## 2023-09-01 NOTE — Discharge Instructions (Signed)
 You were seen in the emergency department today for concerns of leg pain.  Based on your exam, I suspect you likely have piriformis syndrome.  Given that you are unable to take anti-inflammatories due to your kidney condition, I started you on a course of steroids that would last for total of 10 days.  Please take this as prescribed.  You should perform some gentle stretching over the left hip area to help alleviate some of your symptoms.  Avoid any heavy lifting or straining such as squatting, climbing stairs if possible.  For any concerns of new or worsening symptoms, return to the emergency department.

## 2023-09-01 NOTE — ED Triage Notes (Signed)
 Pt endorses left leg pain that started yesterday. She states that she lift heavy weights at the gym and feels like she may have over done it. Her back initially hurt three days ago but it stopped now her left leg is hurting.

## 2023-09-02 NOTE — ED Provider Notes (Signed)
 [place] [facility] Provider Note   CSN: [id number] Arrival date & time: 09/01/23  8144     Patient presents with: Leg Pain   Valerie Whitehead is a 48 y.o. female.  Patient with past history significant for CKD stage IIIa, renal cancer with prior nephrectomy, IBS, hypertension, GERD presents to the emergency department with concerns of leg pain.  She reports that she began to experience left-sided flank pain that radiates from the gluteus into the back of her thigh.  Reports it has been ongoing for the last several days after reportedly lifting heavy weights at the gym.  She initially felt that she had hurt her back but that pain has now started having pain to left leg.  States that she is having difficulty walking due to the pain.  No reported bowel or bladder incontinence or saddle paresthesia.  No prior history of low back pain or sciatic type pain.   Leg Pain      Prior to Admission medications   Medication Sig Start Date End Date Taking? Authorizing Provider  lidocaine  (LIDODERM ) 5 % Place 1 patch onto the skin daily. Remove & Discard patch within 12 hours or as directed by MD 09/01/23  Yes Shameika Speelman A, PA-C  oxyCODONE -acetaminophen  (PERCOCET/ROXICET) 5-325 MG tablet Take 1 tablet by mouth every 6 (six) hours as needed for severe pain (pain score 7-10). 09/01/23  Yes Candies Palm A, PA-C  predniSONE  (DELTASONE ) 10 MG tablet Take 6 tablets (60 mg total) by mouth daily for 5 days, THEN 5 tablets (50 mg total) daily for 1 day, THEN 4 tablets (40 mg total) daily for 1 day, THEN 3 tablets (30 mg total) daily for 1 day, THEN 2 tablets (20 mg total) daily for 1 day, THEN 1 tablet (10 mg total) daily for 1 day. 09/01/23 09/11/23 Yes Sayyid Harewood A, PA-C  buPROPion (WELLBUTRIN XL) 300 MG 24 hr tablet Take 300 mg by mouth daily.      [provider]  ferrous sulfate 325 (65 FE) MG tablet Take 325 mg by mouth daily. 12/17/21   [provider]   labetalol  (NORMODYNE ) 300 MG tablet Take 300 mg by mouth 2 (two) times daily. 06/28/15   [provider]  lisinopril-hydrochlorothiazide (ZESTORETIC) 10-12.5 MG tablet Take 1 tablet by mouth daily.    [provider]  methocarbamol  (ROBAXIN ) 500 MG tablet Take 2 tablets (1,000 mg total) by mouth 4 (four) times daily. 09/22/22   Desiderio Chew, PA-C  omeprazole  (PRILOSEC) 20 MG capsule Take 1 capsule (20 mg total) by mouth daily. 12/03/21   Rancour, Garnette, MD  traMADol  (ULTRAM ) 50 MG tablet Take 1 tablet (50 mg total) by mouth every 6 (six) hours as needed. 05/16/23   Odell Balls, PA-C    Allergies: Lisinopril and Nitrofurantoin    Review of Systems  Musculoskeletal:        Leg pain  All other systems reviewed and are negative.   Updated Vital Signs BP 131/76 (BP Location: Right Arm)   Pulse 82   Temp 98.1 F (36.7 C) (Oral)   Resp 16   Ht 5' 8 (1.727 m)   Wt 99.8 kg   SpO2 98%   BMI 33.45 kg/m   Physical Exam Vitals and nursing note reviewed.  Constitutional:      General: She is not in acute distress.    Appearance: She is well-developed.  HENT:     Head: Normocephalic and atraumatic.  Eyes:  Conjunctiva/sclera: Conjunctivae normal.  Cardiovascular:     Rate and Rhythm: Normal rate and regular rhythm.     Heart sounds: No murmur heard. Pulmonary:     Effort: Pulmonary effort is normal. No respiratory distress.     Breath sounds: Normal breath sounds.  Abdominal:     Palpations: Abdomen is soft.     Tenderness: There is no abdominal tenderness.  Musculoskeletal:        General: Tenderness present. No swelling, deformity or signs of injury.     Cervical back: Neck supple.       Legs:     Comments: TTP with deep palpation of the left gluteus. Palpation causes radiating pain into posterior thigh. Negative straight leg. Positive [place].  Skin:    General: Skin is warm and dry.     Capillary Refill: Capillary refill takes less than 2 seconds.   Neurological:     Mental Status: She is alert.  Psychiatric:        Mood and Affect: Mood normal.     (all labs ordered are listed, but only abnormal results are displayed) Labs Reviewed - No data to display  EKG: None  Radiology: No results found.   Procedures   Medications Ordered in the ED - No data to display                                  Medical Decision Making Risk Prescription drug management.   This patient presents to the ED for concern of leg pain.  Differential diagnosis includes sciatica, cauda equina syndrome, piriformis syndrome, lumbar strain, muscle strain   Problem List / ED Course:  Patient with past history significant for CKD stage IIIa, renal cell carcinoma with prior nephrectomy, diabetes, hypertension, GERD presents to the emergency department with concerns of leg pain.  Reports that 3 days ago, was at the gym lifting heavy weights and felt that she had initially strained her back.  Back pain has resolved and now having pain from the inner right hip with radiation towards the back of the right thigh.  No reported numbness, tingling, weakness.  Denies saddle paresthesia or bowel or bladder incontinence. Exam reveals focal tenderness palpation with deep palpation over the left gluteal muscles.  Pain is worsened with deep palpation that induces radiating symptoms.  Negative straight leg raise.  Positive [place] test. Based on history and exam, suspect that this is likely due to piriformis syndrome. Without recent trauma or injury to cause fracture, doubt imaging needed. Sciatica not as likely given lack of pain rating from the back into the leg.  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  Will discharge home on a prednisone  taper for the next 10 days.  Lidocaine  patch as well as a low-dose Percocet sent to pharmacy for  further pain management.  Encourage close follow-up with PCP for further assessment or return precautions also discussed patient verbalized understanding.  She is stable for outpatient follow-up and discharged home at this time.  Final diagnoses:  Piriformis syndrome of left side    ED Discharge Orders          Ordered    predniSONE  (DELTASONE ) 10 MG tablet  Daily        09/01/23 2316    lidocaine  (LIDODERM ) 5 %  Every 24 hours        09/01/23 2316    oxyCODONE -acetaminophen  (PERCOCET/ROXICET) 5-325 MG tablet  Every 6 hours PRN        09/01/23 2316               Harolyn Cocker A, PA-C 09/02/23 1648    Lenor Hollering, MD 09/04/23 1345
